# Patient Record
Sex: Female | Born: 1950
Health system: Southern US, Community
[De-identification: ages and names within clinical notes are randomized; demographics above are authoritative.]

## PROBLEM LIST (undated history)

## (undated) DIAGNOSIS — R519 Headache, unspecified: Secondary | ICD-10-CM

## (undated) DIAGNOSIS — K219 Gastro-esophageal reflux disease without esophagitis: Secondary | ICD-10-CM

## (undated) DIAGNOSIS — Z1401 Asymptomatic hemophilia A carrier: Secondary | ICD-10-CM

## (undated) DIAGNOSIS — L723 Sebaceous cyst: Secondary | ICD-10-CM

## (undated) DIAGNOSIS — K5792 Diverticulitis of intestine, part unspecified, without perforation or abscess without bleeding: Secondary | ICD-10-CM

## (undated) DIAGNOSIS — R51 Headache: Secondary | ICD-10-CM

## (undated) DIAGNOSIS — C50919 Malignant neoplasm of unspecified site of unspecified female breast: Secondary | ICD-10-CM

## (undated) DIAGNOSIS — C801 Malignant (primary) neoplasm, unspecified: Secondary | ICD-10-CM

## (undated) DIAGNOSIS — E079 Disorder of thyroid, unspecified: Secondary | ICD-10-CM

## (undated) DIAGNOSIS — E039 Hypothyroidism, unspecified: Secondary | ICD-10-CM

## (undated) HISTORY — DX: Sebaceous cyst: L72.3

## (undated) HISTORY — DX: Gastro-esophageal reflux disease without esophagitis: K21.9

## (undated) HISTORY — DX: Malignant (primary) neoplasm, unspecified: C80.1

## (undated) HISTORY — DX: Headache, unspecified: R51.9

## (undated) HISTORY — PX: INCISE AND DRAIN ABCESS: PRO64

## (undated) HISTORY — DX: Disorder of thyroid, unspecified: E07.9

## (undated) HISTORY — DX: Headache: R51

## (undated) HISTORY — DX: Diverticulitis of intestine, part unspecified, without perforation or abscess without bleeding: K57.92

---

## 1992-10-14 HISTORY — PX: TOTAL ABDOMINAL HYSTERECTOMY: SHX209

## 1992-10-14 HISTORY — PX: ABDOMINAL HYSTERECTOMY: SHX81

## 1996-10-14 DIAGNOSIS — C50919 Malignant neoplasm of unspecified site of unspecified female breast: Secondary | ICD-10-CM

## 1996-10-14 HISTORY — DX: Malignant neoplasm of unspecified site of unspecified female breast: C50.919

## 1996-10-14 HISTORY — PX: MASTECTOMY: SHX3

## 1998-02-09 ENCOUNTER — Encounter: Admission: RE | Admit: 1998-02-09 | Discharge: 1998-05-10 | Payer: Self-pay | Admitting: Radiation Oncology

## 1999-07-30 ENCOUNTER — Other Ambulatory Visit: Admission: RE | Admit: 1999-07-30 | Discharge: 1999-07-30 | Payer: Self-pay | Admitting: *Deleted

## 1999-09-05 ENCOUNTER — Encounter: Payer: Self-pay | Admitting: Hematology and Oncology

## 1999-09-05 ENCOUNTER — Encounter: Admission: RE | Admit: 1999-09-05 | Discharge: 1999-09-05 | Payer: Self-pay | Admitting: Hematology and Oncology

## 2000-03-11 ENCOUNTER — Encounter: Payer: Self-pay | Admitting: Hematology and Oncology

## 2000-03-11 ENCOUNTER — Encounter: Admission: RE | Admit: 2000-03-11 | Discharge: 2000-03-11 | Payer: Self-pay | Admitting: Hematology and Oncology

## 2001-11-23 ENCOUNTER — Ambulatory Visit (HOSPITAL_COMMUNITY): Admission: RE | Admit: 2001-11-23 | Discharge: 2001-11-23 | Payer: Self-pay | Admitting: Gastroenterology

## 2004-07-14 ENCOUNTER — Ambulatory Visit: Payer: Self-pay | Admitting: Oncology

## 2004-08-14 ENCOUNTER — Ambulatory Visit: Payer: Self-pay | Admitting: Oncology

## 2004-12-31 ENCOUNTER — Ambulatory Visit: Payer: Self-pay | Admitting: Oncology

## 2005-08-07 ENCOUNTER — Ambulatory Visit: Payer: Self-pay | Admitting: Oncology

## 2005-08-19 ENCOUNTER — Ambulatory Visit: Payer: Self-pay | Admitting: Oncology

## 2006-02-20 ENCOUNTER — Ambulatory Visit: Payer: Self-pay | Admitting: Oncology

## 2006-08-11 ENCOUNTER — Ambulatory Visit: Payer: Self-pay | Admitting: Oncology

## 2006-11-06 ENCOUNTER — Ambulatory Visit: Payer: Self-pay | Admitting: Oncology

## 2007-08-14 ENCOUNTER — Ambulatory Visit: Payer: Self-pay | Admitting: Oncology

## 2007-11-15 ENCOUNTER — Ambulatory Visit: Payer: Self-pay | Admitting: Oncology

## 2007-11-20 ENCOUNTER — Ambulatory Visit: Payer: Self-pay | Admitting: Oncology

## 2007-12-13 ENCOUNTER — Ambulatory Visit: Payer: Self-pay | Admitting: Oncology

## 2007-12-31 ENCOUNTER — Ambulatory Visit: Payer: Self-pay | Admitting: Gastroenterology

## 2008-11-04 ENCOUNTER — Ambulatory Visit: Payer: Self-pay | Admitting: Oncology

## 2008-11-14 ENCOUNTER — Ambulatory Visit: Payer: Self-pay | Admitting: Oncology

## 2008-11-18 ENCOUNTER — Ambulatory Visit: Payer: Self-pay | Admitting: Oncology

## 2008-12-12 ENCOUNTER — Ambulatory Visit: Payer: Self-pay | Admitting: Oncology

## 2009-11-10 ENCOUNTER — Ambulatory Visit: Payer: Self-pay | Admitting: Oncology

## 2010-10-25 ENCOUNTER — Ambulatory Visit: Payer: Self-pay

## 2010-11-19 ENCOUNTER — Ambulatory Visit: Payer: Self-pay

## 2011-01-22 ENCOUNTER — Ambulatory Visit: Payer: Self-pay | Admitting: Gastroenterology

## 2011-01-24 LAB — PATHOLOGY REPORT

## 2011-07-04 ENCOUNTER — Encounter (INDEPENDENT_AMBULATORY_CARE_PROVIDER_SITE_OTHER): Payer: Self-pay | Admitting: General Surgery

## 2011-07-04 ENCOUNTER — Ambulatory Visit (INDEPENDENT_AMBULATORY_CARE_PROVIDER_SITE_OTHER): Payer: 59 | Admitting: General Surgery

## 2011-07-04 VITALS — BP 140/82 | HR 80 | Temp 97.6°F | Resp 16 | Ht 62.0 in | Wt 172.2 lb

## 2011-07-04 DIAGNOSIS — L723 Sebaceous cyst: Secondary | ICD-10-CM

## 2011-07-04 NOTE — Patient Instructions (Signed)
Cover with gauze.  Remove strip Saturday.  Keep covered with gauze thereafter.  Take your antibiotics.

## 2011-07-04 NOTE — Progress Notes (Signed)
Subjective:     Patient ID: Charlotte Reyes, female   DOB: 27-May-1951, 60 y.o.   MRN: [id number]  HPI Patient has a history of several years of a cyst on her right shoulder. For the past couple days this has been painful and much larger. She was seen by her primary care physician. Doxycycline was prescribed. She is referred for urgent clinic.  Review of Systems  Constitutional: Negative.   HENT: Negative.   Eyes: Negative.   Respiratory: Negative.   Cardiovascular: Negative.   Genitourinary: Negative.   Musculoskeletal: Negative.   History of breast cancer    Objective:   Physical Exam  HENT:  Head: Normocephalic and atraumatic.  Eyes: Pupils are equal, round, and reactive to light.  Neck: Normal range of motion.  Cardiovascular: Normal rate and normal heart sounds.   Pulmonary/Chest: Effort normal. No respiratory distress. She has no wheezes.  Abdominal: Soft. There is no tenderness.  Right shoulder has a 2 cm erythematous mass consistent with an infected sebaceous cyst Procedure note: The area was prepped in a sterile fashion. Local anesthetic was injected. The cyst was incised and drained for the release of a large amount of purulent sebum. The wound was thoroughly cleaned out and packed with gauze. Hemostasis was good. A gauze dressing was applied     Assessment:     Infected sebaceous cyst    Plan:     The area was incised and drained as above. Wound care instructions were given. She will take the doxycycline as prescribed. See her back next week.

## 2011-07-24 ENCOUNTER — Encounter (INDEPENDENT_AMBULATORY_CARE_PROVIDER_SITE_OTHER): Payer: Self-pay | Admitting: General Surgery

## 2011-07-24 ENCOUNTER — Ambulatory Visit (INDEPENDENT_AMBULATORY_CARE_PROVIDER_SITE_OTHER): Payer: 59 | Admitting: General Surgery

## 2011-07-24 VITALS — BP 124/86 | HR 72 | Temp 97.6°F | Resp 16 | Ht 62.0 in | Wt 173.0 lb

## 2011-07-24 DIAGNOSIS — L723 Sebaceous cyst: Secondary | ICD-10-CM

## 2011-07-24 NOTE — Progress Notes (Signed)
Subjective:     Patient ID: Charlotte Reyes, female   DOB: 1951/01/11, 60 y.o.   MRN: [id number]  HPI Patient presents status post incision and drainage of infected sebaceous cyst right shoulder. She completed a course of doxycycline and she feels there is much better  Review of Systems     Objective:   Physical Exam    Physical exam the incision and drainage area is completely healed. There is no significant underlying nodularity. There is no evidence of ongoing infection. Assessment:     Doing very well status post incision and drainage of infected sebaceous cyst    Plan:     I reassured the patient that there is no evidence of malignancy. She has agreed to call if any swelling or changes, at the site. I do advise her that the cyst may recur and require formal excision. The questions were answered.

## 2011-07-24 NOTE — Patient Instructions (Signed)
Call if any changes at the site

## 2014-05-05 DIAGNOSIS — K219 Gastro-esophageal reflux disease without esophagitis: Secondary | ICD-10-CM | POA: Insufficient documentation

## 2014-05-19 ENCOUNTER — Ambulatory Visit: Payer: Self-pay | Admitting: Family Medicine

## 2015-11-28 DIAGNOSIS — N390 Urinary tract infection, site not specified: Secondary | ICD-10-CM | POA: Diagnosis not present

## 2015-11-28 DIAGNOSIS — R300 Dysuria: Secondary | ICD-10-CM | POA: Diagnosis not present

## 2016-01-01 ENCOUNTER — Other Ambulatory Visit: Payer: Self-pay | Admitting: Family Medicine

## 2016-01-01 DIAGNOSIS — Z1231 Encounter for screening mammogram for malignant neoplasm of breast: Secondary | ICD-10-CM

## 2016-01-05 DIAGNOSIS — L723 Sebaceous cyst: Secondary | ICD-10-CM | POA: Diagnosis not present

## 2016-01-11 DIAGNOSIS — L723 Sebaceous cyst: Secondary | ICD-10-CM | POA: Diagnosis not present

## 2016-01-16 ENCOUNTER — Other Ambulatory Visit: Payer: Self-pay | Admitting: Family Medicine

## 2016-01-16 ENCOUNTER — Ambulatory Visit
Admission: RE | Admit: 2016-01-16 | Discharge: 2016-01-16 | Disposition: A | Discharge status: Home / Self Care | Payer: PPO | Source: Ambulatory Visit | Attending: Family Medicine | Admitting: Family Medicine

## 2016-01-16 DIAGNOSIS — Z1231 Encounter for screening mammogram for malignant neoplasm of breast: Secondary | ICD-10-CM | POA: Diagnosis not present

## 2016-01-16 HISTORY — DX: Malignant neoplasm of unspecified site of unspecified female breast: C50.919

## 2016-03-05 DIAGNOSIS — Z4432 Encounter for fitting and adjustment of external left breast prosthesis: Secondary | ICD-10-CM | POA: Diagnosis not present

## 2016-03-05 DIAGNOSIS — C50112 Malignant neoplasm of central portion of left female breast: Secondary | ICD-10-CM | POA: Diagnosis not present

## 2016-03-12 DIAGNOSIS — E039 Hypothyroidism, unspecified: Secondary | ICD-10-CM | POA: Diagnosis not present

## 2016-03-12 DIAGNOSIS — Z131 Encounter for screening for diabetes mellitus: Secondary | ICD-10-CM | POA: Diagnosis not present

## 2016-03-19 DIAGNOSIS — K219 Gastro-esophageal reflux disease without esophagitis: Secondary | ICD-10-CM | POA: Diagnosis not present

## 2016-03-19 DIAGNOSIS — J301 Allergic rhinitis due to pollen: Secondary | ICD-10-CM | POA: Diagnosis not present

## 2016-03-19 DIAGNOSIS — C50112 Malignant neoplasm of central portion of left female breast: Secondary | ICD-10-CM | POA: Diagnosis not present

## 2016-03-19 DIAGNOSIS — Z13220 Encounter for screening for lipoid disorders: Secondary | ICD-10-CM | POA: Diagnosis not present

## 2016-03-19 DIAGNOSIS — E039 Hypothyroidism, unspecified: Secondary | ICD-10-CM | POA: Diagnosis not present

## 2016-03-19 DIAGNOSIS — Z79899 Other long term (current) drug therapy: Secondary | ICD-10-CM | POA: Diagnosis not present

## 2016-03-19 DIAGNOSIS — Z4432 Encounter for fitting and adjustment of external left breast prosthesis: Secondary | ICD-10-CM | POA: Diagnosis not present

## 2016-04-02 DIAGNOSIS — H2513 Age-related nuclear cataract, bilateral: Secondary | ICD-10-CM | POA: Diagnosis not present

## 2016-10-03 DIAGNOSIS — E039 Hypothyroidism, unspecified: Secondary | ICD-10-CM | POA: Diagnosis not present

## 2016-10-03 DIAGNOSIS — Z13220 Encounter for screening for lipoid disorders: Secondary | ICD-10-CM | POA: Diagnosis not present

## 2016-10-03 DIAGNOSIS — Z79899 Other long term (current) drug therapy: Secondary | ICD-10-CM | POA: Diagnosis not present

## 2016-10-04 DIAGNOSIS — Z0000 Encounter for general adult medical examination without abnormal findings: Secondary | ICD-10-CM | POA: Diagnosis not present

## 2016-10-04 DIAGNOSIS — Z23 Encounter for immunization: Secondary | ICD-10-CM | POA: Diagnosis not present

## 2016-10-04 DIAGNOSIS — Z13220 Encounter for screening for lipoid disorders: Secondary | ICD-10-CM | POA: Diagnosis not present

## 2016-10-04 DIAGNOSIS — Z79899 Other long term (current) drug therapy: Secondary | ICD-10-CM | POA: Diagnosis not present

## 2016-10-04 DIAGNOSIS — E039 Hypothyroidism, unspecified: Secondary | ICD-10-CM | POA: Diagnosis not present

## 2016-11-26 DIAGNOSIS — N390 Urinary tract infection, site not specified: Secondary | ICD-10-CM | POA: Diagnosis not present

## 2016-11-26 DIAGNOSIS — R300 Dysuria: Secondary | ICD-10-CM | POA: Diagnosis not present

## 2017-04-01 DIAGNOSIS — H2513 Age-related nuclear cataract, bilateral: Secondary | ICD-10-CM | POA: Diagnosis not present

## 2017-04-22 DIAGNOSIS — K210 Gastro-esophageal reflux disease with esophagitis: Secondary | ICD-10-CM | POA: Diagnosis not present

## 2017-04-22 DIAGNOSIS — Z86010 Personal history of colonic polyps: Secondary | ICD-10-CM | POA: Diagnosis not present

## 2017-07-29 ENCOUNTER — Other Ambulatory Visit: Payer: Self-pay | Admitting: Family Medicine

## 2017-07-29 DIAGNOSIS — Z1231 Encounter for screening mammogram for malignant neoplasm of breast: Secondary | ICD-10-CM

## 2017-08-12 ENCOUNTER — Ambulatory Visit
Admission: RE | Admit: 2017-08-12 | Discharge: 2017-08-12 | Disposition: A | Discharge status: Home / Self Care | Payer: PPO | Source: Ambulatory Visit | Attending: Family Medicine | Admitting: Family Medicine

## 2017-08-12 DIAGNOSIS — Z1231 Encounter for screening mammogram for malignant neoplasm of breast: Secondary | ICD-10-CM | POA: Diagnosis not present

## 2017-09-01 ENCOUNTER — Encounter: Payer: Self-pay | Admitting: *Deleted

## 2017-09-02 ENCOUNTER — Ambulatory Visit: Payer: PPO | Admitting: Anesthesiology

## 2017-09-02 ENCOUNTER — Encounter
Admission: RE | Disposition: A | Discharge status: Home / Self Care | Payer: Self-pay | Source: Ambulatory Visit | Attending: Gastroenterology

## 2017-09-02 ENCOUNTER — Encounter: Payer: Self-pay | Admitting: *Deleted

## 2017-09-02 ENCOUNTER — Ambulatory Visit
Admission: RE | Admit: 2017-09-02 | Discharge: 2017-09-02 | Disposition: A | Discharge status: Home / Self Care | Payer: PPO | Source: Ambulatory Visit | Attending: Gastroenterology | Admitting: Gastroenterology

## 2017-09-02 DIAGNOSIS — K5730 Diverticulosis of large intestine without perforation or abscess without bleeding: Secondary | ICD-10-CM | POA: Insufficient documentation

## 2017-09-02 DIAGNOSIS — Z1401 Asymptomatic hemophilia A carrier: Secondary | ICD-10-CM | POA: Diagnosis not present

## 2017-09-02 DIAGNOSIS — Z1211 Encounter for screening for malignant neoplasm of colon: Secondary | ICD-10-CM | POA: Diagnosis not present

## 2017-09-02 DIAGNOSIS — D125 Benign neoplasm of sigmoid colon: Secondary | ICD-10-CM | POA: Diagnosis not present

## 2017-09-02 DIAGNOSIS — Z86010 Personal history of colonic polyps: Secondary | ICD-10-CM | POA: Insufficient documentation

## 2017-09-02 DIAGNOSIS — E039 Hypothyroidism, unspecified: Secondary | ICD-10-CM | POA: Diagnosis not present

## 2017-09-02 DIAGNOSIS — Z853 Personal history of malignant neoplasm of breast: Secondary | ICD-10-CM | POA: Diagnosis not present

## 2017-09-02 DIAGNOSIS — Z79899 Other long term (current) drug therapy: Secondary | ICD-10-CM | POA: Diagnosis not present

## 2017-09-02 DIAGNOSIS — K635 Polyp of colon: Secondary | ICD-10-CM | POA: Diagnosis not present

## 2017-09-02 DIAGNOSIS — K5790 Diverticulosis of intestine, part unspecified, without perforation or abscess without bleeding: Secondary | ICD-10-CM | POA: Diagnosis not present

## 2017-09-02 DIAGNOSIS — Z800 Family history of malignant neoplasm of digestive organs: Secondary | ICD-10-CM | POA: Diagnosis not present

## 2017-09-02 HISTORY — DX: Asymptomatic hemophilia A carrier: Z14.01

## 2017-09-02 HISTORY — DX: Hypothyroidism, unspecified: E03.9

## 2017-09-02 HISTORY — PX: COLONOSCOPY WITH PROPOFOL: SHX5780

## 2017-09-02 SURGERY — COLONOSCOPY WITH PROPOFOL
Anesthesia: General

## 2017-09-02 MED ORDER — PROPOFOL 10 MG/ML IV BOLUS
INTRAVENOUS | Status: DC | PRN
  Administered 2017-09-02: 50 mg via INTRAVENOUS

## 2017-09-02 MED ORDER — LIDOCAINE HCL (PF) 2 % IJ SOLN
INTRAMUSCULAR | Status: AC
  Filled 2017-09-02: qty 10

## 2017-09-02 MED ORDER — PROPOFOL 500 MG/50ML IV EMUL
INTRAVENOUS | Status: AC
  Filled 2017-09-02: qty 50

## 2017-09-02 MED ORDER — SODIUM CHLORIDE 0.9 % IV SOLN: INTRAVENOUS | Status: DC

## 2017-09-02 MED ORDER — PROPOFOL 500 MG/50ML IV EMUL
INTRAVENOUS | Status: DC | PRN
  Administered 2017-09-02: 150 ug/kg/min via INTRAVENOUS

## 2017-09-02 MED ORDER — SODIUM CHLORIDE 0.9 % IV SOLN
INTRAVENOUS | Status: DC
  Administered 2017-09-02: 1000 mL via INTRAVENOUS
  Administered 2017-09-02: 08:00:00 via INTRAVENOUS

## 2017-09-02 MED ORDER — LIDOCAINE HCL (CARDIAC) 20 MG/ML IV SOLN
INTRAVENOUS | Status: DC | PRN
  Administered 2017-09-02: 80 mg via INTRAVENOUS

## 2017-09-02 NOTE — H&P (Signed)
Outpatient short stay form Pre-procedure 09/02/2017 8:14 AM Lollie Sails MD  Primary Physician: Dr Derinda Late  Reason for visit:  Colonoscopy  History of present illness:  Patient is a 66 year old female presenting today as above. She has a personal history of adenomatous colon polyps and family history of colon cancer in a primary relative. She tolerated her prep well. She takes no aspirin or blood thinning agents although occasionally will take a Excedrin has not taken that for 4-5 days.    Current Facility-Administered Medications:  .  0.9 %  sodium chloride infusion, , Intravenous, Continuous, Lollie Sails, MD  Medications Prior to Admission  Medication Sig Dispense Refill Last Dose  . fexofenadine (ALLEGRA) 180 MG tablet Take 180 mg daily by mouth.     . lansoprazole (PREVACID) 15 MG capsule Take 15 mg daily at 12 noon by mouth.     . Multiple Vitamin (MULTIVITAMIN) tablet Take 1 tablet daily by mouth.     . levothyroxine (SYNTHROID, LEVOTHROID) 88 MCG tablet Take 88 mcg by mouth daily.     Taking  . liothyronine (CYTOMEL) 5 MCG tablet Take 5 mcg by mouth daily.     Taking     No Known Allergies   Past Medical History:  Diagnosis Date  . Breast cancer ([place]) 1998   left  . Cancer ([place])   . Generalized headaches    may be due to stress  . 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 Hypothyroidism   . Sebaceous cyst    on back  . Thyroid disease     Review of systems:      Physical Exam    Heart and lungs: Regular rate and rhythm without rub or gallop, lungs are bilaterally clear    HEENT: Normocephalic atraumatic eyes are anicteric    Other:     Pertinant exam for procedure: Soft nontender nondistended bowel sounds positive normoactive    Planned proceedures: Colonoscopy and indicated procedures. I have discussed the risks benefits and complications of procedures to include not limited to bleeding, infection, perforation and the risk of sedation and the patient  wishes to proceed.    Lollie Sails, MD Gastroenterology 09/02/2017  8:14 AM

## 2017-09-02 NOTE — Transfer of Care (Signed)
Immediate Anesthesia Transfer of Care Note  Patient: Charlotte Reyes  Procedure(s) Performed: COLONOSCOPY WITH PROPOFOL (N/A )  Patient Location: Endoscopy Unit  Anesthesia Type:General  Level of Consciousness: drowsy and patient cooperative  Airway & Oxygen Therapy: Patient Spontanous Breathing and Patient connected to nasal cannula oxygen  Post-op Assessment: Report given to RN, Post -op Vital signs reviewed and stable and Patient moving all extremities X 4  Post vital signs: Reviewed and stable  Last Vitals:  Vitals:   09/02/17 0804  BP: 116/77  Pulse: 94  Resp: 20  Temp: 36.6 C  SpO2: 100%    Last Pain:  Vitals:   09/02/17 0804  TempSrc: Tympanic         Complications: No apparent anesthesia complications

## 2017-09-02 NOTE — Anesthesia Post-op Follow-up Note (Signed)
Anesthesia QCDR form completed.        

## 2017-09-02 NOTE — Anesthesia Preprocedure Evaluation (Signed)
Anesthesia Evaluation  Patient identified by MRN, date of birth, ID band Patient awake    Reviewed: Allergy & Precautions, H&P , NPO status , Patient's Chart, lab work & pertinent test results, reviewed documented beta blocker date and time   Airway Mallampati: II   Neck ROM: full    Dental  (+) Poor Dentition   Pulmonary neg pulmonary ROS,    Pulmonary exam normal        Cardiovascular negative cardio ROS Normal cardiovascular exam Rhythm:regular Rate:Normal     Neuro/Psych  Headaches, negative neurological ROS  negative psych ROS   GI/Hepatic negative GI ROS, Neg liver ROS,   Endo/Other  negative endocrine ROSHypothyroidism   Renal/GU negative Renal ROS  negative genitourinary   Musculoskeletal   Abdominal   Peds  Hematology negative hematology ROS (+)   Anesthesia Other Findings Past Medical History: 1998: Breast cancer ([place])     Comment:  left No date: Cancer ([place]) No date: Generalized headaches     Comment:  may be due to stress 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 No date: Hypothyroidism No date: Sebaceous cyst     Comment:  on back No date: Thyroid disease Past Surgical History: 1994: ABDOMINAL HYSTERECTOMY No date: INCISE AND DRAIN ABCESS     Comment:  sebaceous cyst on back 1998: MASTECTOMY     Comment:  left   Reproductive/Obstetrics negative OB ROS                             Anesthesia Physical Anesthesia Plan  ASA: III  Anesthesia Plan: General   Post-op Pain Management:    Induction:   PONV Risk Score and Plan: 3  Airway Management Planned:   Additional Equipment:   Intra-op Plan:   Post-operative Plan:   Informed Consent: I have reviewed the patients History and Physical, chart, labs and discussed the procedure including the risks, benefits and alternatives for the proposed anesthesia with the patient or authorized representative who has indicated his/her  understanding and acceptance.   Dental Advisory Given  Plan Discussed with: CRNA  Anesthesia Plan Comments:         Anesthesia Quick Evaluation

## 2017-09-02 NOTE — Op Note (Addendum)
[facility] Gastroenterology Patient Name: Charlotte Reyes Procedure Date: 09/02/2017 8:13 AM MRN: [id number] Account #: [id number] Date of Birth: 09-22-1951 Admit Type: Outpatient Age: 66 Room: [facility] ENDO ROOM 3 Gender: Female Note Status: Finalized Procedure:            Colonoscopy Indications:          Family history of colon cancer in a first-degree                        relative, Personal history of colonic polyps Providers:            Lollie Sails, MD Referring MD:         Caprice Renshaw MD (Referring MD) Medicines:            Monitored Anesthesia Care Complications:        No immediate complications. Procedure:            Pre-Anesthesia Assessment:                       - ASA Grade Assessment: III - A patient with severe                        systemic disease.                       After obtaining informed consent, the colonoscope was                        passed under direct vision. Throughout the procedure,                        the patient's blood pressure, pulse, and oxygen                        saturations were monitored continuously. The                        Colonoscope was introduced through the anus and                        advanced to the the cecum, identified by appendiceal                        orifice and ileocecal valve. The colonoscopy was                        performed with moderate difficulty due to a tortuous                        colon. Successful completion of the procedure was aided                        by changing the patient to a supine position and                        changing the patient to a prone position. The patient                        tolerated the procedure well. The quality of the bowel  preparation was good. Findings:      A 3 mm polyp was found in the sigmoid colon. The polyp was sessile. The       polyp was removed with a cold biopsy forceps. Resection and retrieval   were complete.      Multiple medium-mouthed diverticula were found in the sigmoid colon and       descending colon.      The digital rectal exam was normal.      The exam was otherwise without abnormality. Impression:           - One 3 mm polyp in the sigmoid colon, removed with a                        cold biopsy forceps. Resected and retrieved.                       - Diverticulosis in the sigmoid colon and in the                        descending colon.                       - The examination was otherwise normal. Recommendation:       - Discharge patient to home.                       - Advance diet as tolerated. Procedure Code(s):    --- Professional ---                       [phone], Colonoscopy, flexible; with biopsy, single or                        multiple Diagnosis Code(s):    --- Professional ---                       D12.5, Benign neoplasm of sigmoid colon                       Z80.0, Family history of malignant neoplasm of                        digestive organs                       Z86.010, Personal history of colonic polyps                       K57.30, Diverticulosis of large intestine without                        perforation or abscess without bleeding CPT copyright 2016 [employer]. All rights reserved. The codes documented in this report are preliminary and upon coder review may  be revised to meet current compliance requirements. Lollie Sails, MD 09/02/2017 8:52:04 AM This report has been signed electronically. Number of Addenda: 0 Note Initiated On: 09/02/2017 8:13 AM Scope Withdrawal Time: 0 hours 9 minutes 53 seconds  Total Procedure Duration: 0 hours 23 minutes 4 seconds       [facility]

## 2017-09-03 ENCOUNTER — Encounter: Payer: Self-pay | Admitting: Gastroenterology

## 2017-09-03 LAB — SURGICAL PATHOLOGY

## 2017-09-03 NOTE — Anesthesia Postprocedure Evaluation (Signed)
Anesthesia Post Note  Patient: Charlotte Reyes  Procedure(s) Performed: COLONOSCOPY WITH PROPOFOL (N/A )  Patient location during evaluation: PACU Anesthesia Type: General Level of consciousness: awake and alert Pain management: pain level controlled Vital Signs Assessment: post-procedure vital signs reviewed and stable Respiratory status: spontaneous breathing, nonlabored ventilation, respiratory function stable and patient connected to nasal cannula oxygen Cardiovascular status: blood pressure returned to baseline and stable Postop Assessment: no apparent nausea or vomiting Anesthetic complications: no     Last Vitals:  Vitals:   09/02/17 0912 09/02/17 0922  BP: 125/64 135/71  Pulse: 93 83  Resp: 17 15  Temp:    SpO2: 100% 100%    Last Pain:  Vitals:   09/03/17 0730  TempSrc:   PainSc: 0-No pain                 Molli Barrows

## 2017-10-02 DIAGNOSIS — E039 Hypothyroidism, unspecified: Secondary | ICD-10-CM | POA: Diagnosis not present

## 2017-10-02 DIAGNOSIS — Z13220 Encounter for screening for lipoid disorders: Secondary | ICD-10-CM | POA: Diagnosis not present

## 2017-10-02 DIAGNOSIS — Z79899 Other long term (current) drug therapy: Secondary | ICD-10-CM | POA: Diagnosis not present

## 2017-10-09 DIAGNOSIS — Z0000 Encounter for general adult medical examination without abnormal findings: Secondary | ICD-10-CM | POA: Diagnosis not present

## 2018-07-15 ENCOUNTER — Other Ambulatory Visit: Payer: Self-pay | Admitting: Family Medicine

## 2018-07-15 DIAGNOSIS — Z1231 Encounter for screening mammogram for malignant neoplasm of breast: Secondary | ICD-10-CM

## 2018-07-16 DIAGNOSIS — C50112 Malignant neoplasm of central portion of left female breast: Secondary | ICD-10-CM | POA: Diagnosis not present

## 2018-07-16 DIAGNOSIS — Z4432 Encounter for fitting and adjustment of external left breast prosthesis: Secondary | ICD-10-CM | POA: Diagnosis not present

## 2018-07-31 DIAGNOSIS — Z4432 Encounter for fitting and adjustment of external left breast prosthesis: Secondary | ICD-10-CM | POA: Diagnosis not present

## 2018-07-31 DIAGNOSIS — C50112 Malignant neoplasm of central portion of left female breast: Secondary | ICD-10-CM | POA: Diagnosis not present

## 2018-08-04 DIAGNOSIS — J0100 Acute maxillary sinusitis, unspecified: Secondary | ICD-10-CM | POA: Diagnosis not present

## 2018-08-13 ENCOUNTER — Ambulatory Visit
Admission: RE | Admit: 2018-08-13 | Discharge: 2018-08-13 | Disposition: A | Discharge status: Home / Self Care | Payer: PPO | Source: Ambulatory Visit | Attending: Family Medicine | Admitting: Family Medicine

## 2018-08-13 DIAGNOSIS — Z1231 Encounter for screening mammogram for malignant neoplasm of breast: Secondary | ICD-10-CM

## 2018-10-13 DIAGNOSIS — E039 Hypothyroidism, unspecified: Secondary | ICD-10-CM | POA: Diagnosis not present

## 2018-10-13 DIAGNOSIS — E7800 Pure hypercholesterolemia, unspecified: Secondary | ICD-10-CM | POA: Diagnosis not present

## 2018-10-13 DIAGNOSIS — Z79899 Other long term (current) drug therapy: Secondary | ICD-10-CM | POA: Diagnosis not present

## 2018-10-20 DIAGNOSIS — Z0000 Encounter for general adult medical examination without abnormal findings: Secondary | ICD-10-CM | POA: Diagnosis not present

## 2019-03-19 DIAGNOSIS — H524 Presbyopia: Secondary | ICD-10-CM | POA: Diagnosis not present

## 2019-12-06 ENCOUNTER — Other Ambulatory Visit: Payer: Self-pay | Admitting: Family Medicine

## 2019-12-06 DIAGNOSIS — Z1231 Encounter for screening mammogram for malignant neoplasm of breast: Secondary | ICD-10-CM

## 2020-01-07 ENCOUNTER — Other Ambulatory Visit: Payer: Self-pay | Admitting: Family Medicine

## 2020-01-07 ENCOUNTER — Ambulatory Visit
Admission: RE | Admit: 2020-01-07 | Discharge: 2020-01-07 | Disposition: A | Discharge status: Home / Self Care | Payer: Medicare HMO | Source: Ambulatory Visit | Attending: Family Medicine | Admitting: Family Medicine

## 2020-01-07 DIAGNOSIS — Z1231 Encounter for screening mammogram for malignant neoplasm of breast: Secondary | ICD-10-CM | POA: Insufficient documentation

## 2020-07-31 ENCOUNTER — Ambulatory Visit: Payer: PPO | Attending: Internal Medicine

## 2020-07-31 DIAGNOSIS — Z23 Encounter for immunization: Secondary | ICD-10-CM

## 2020-07-31 NOTE — Progress Notes (Signed)
   Covid-19 Vaccination Clinic  Name:  Meridee Branum    MRN: [id number] DOB: 06/27/1951  07/31/2020  Ms. Meharg was observed post Covid-19 immunization for 15 minutes without incident. She was provided with Vaccine Information Sheet and instruction to access the V-Safe system.   Ms. Roehm was instructed to call 911 with any severe reactions post vaccine: Marland Kitchen Difficulty breathing  . Swelling of face and throat  . A fast heartbeat  . A bad rash all over body  . Dizziness and weakness

## 2021-01-23 ENCOUNTER — Other Ambulatory Visit: Payer: Self-pay | Admitting: Family Medicine

## 2021-01-23 DIAGNOSIS — Z1231 Encounter for screening mammogram for malignant neoplasm of breast: Secondary | ICD-10-CM

## 2021-02-06 ENCOUNTER — Ambulatory Visit
Admission: RE | Admit: 2021-02-06 | Discharge: 2021-02-06 | Disposition: A | Discharge status: Home / Self Care | Payer: Medicare HMO | Source: Ambulatory Visit | Attending: Family Medicine | Admitting: Family Medicine

## 2021-02-06 ENCOUNTER — Other Ambulatory Visit: Payer: Self-pay

## 2021-02-06 DIAGNOSIS — Z1231 Encounter for screening mammogram for malignant neoplasm of breast: Secondary | ICD-10-CM | POA: Diagnosis not present

## 2021-08-25 IMAGING — MG DIGITAL SCREENING UNILAT RIGHT W/ TOMO W/ CAD
4 series · 4 of 12 positions shown · non-contrast
Comparison: Previous exam(s).

CLINICAL DATA: Screening.

EXAM:
DIGITAL SCREENING UNILATERAL RIGHT MAMMOGRAM WITH CAD AND TOMO

[R MLO synth-2D]
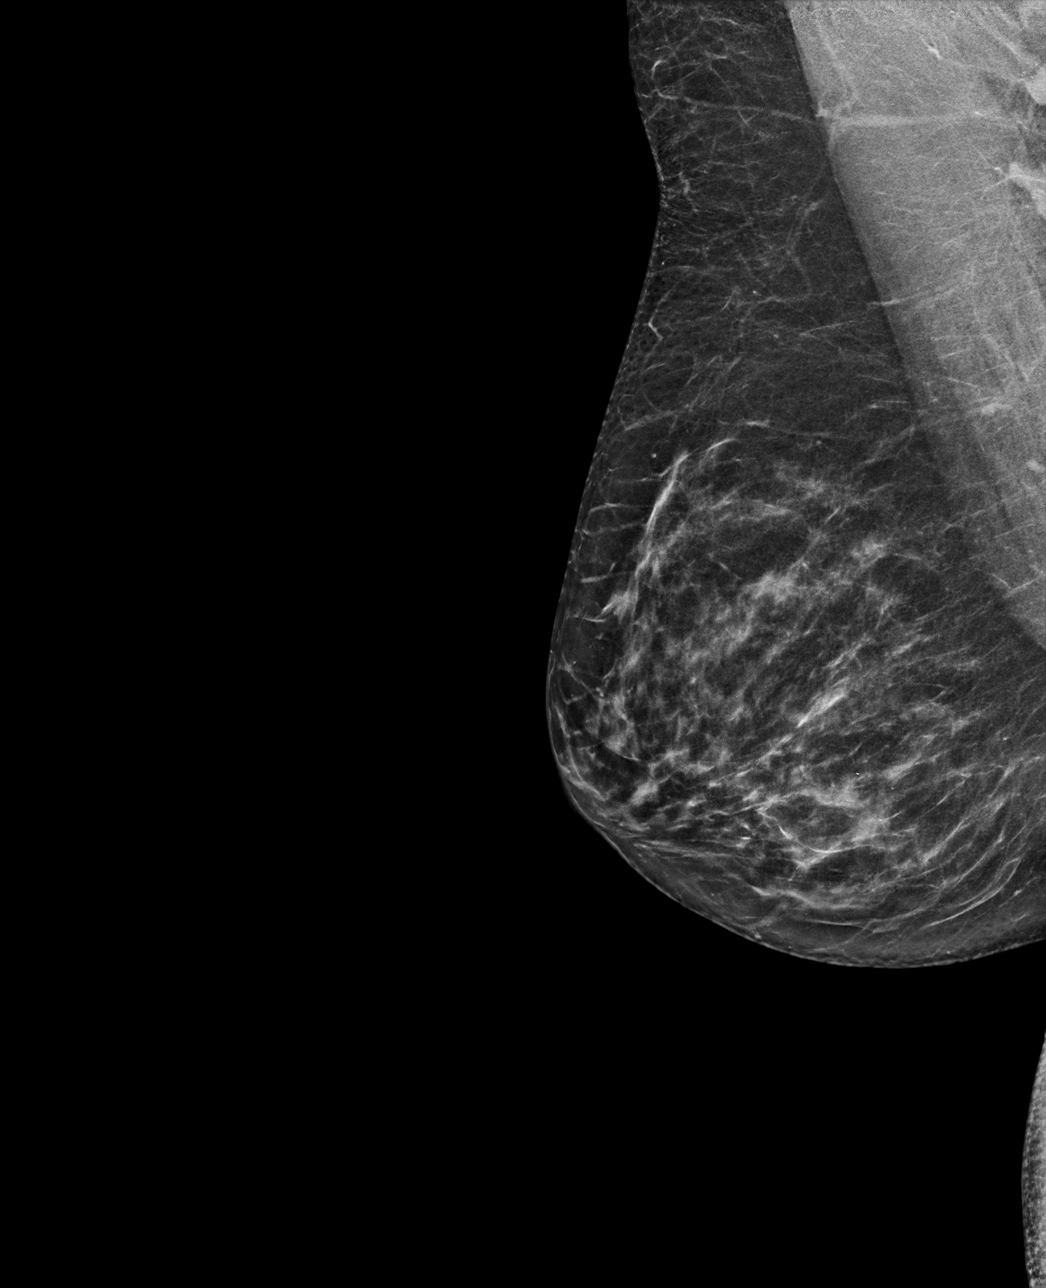

[R CC synth-2D]
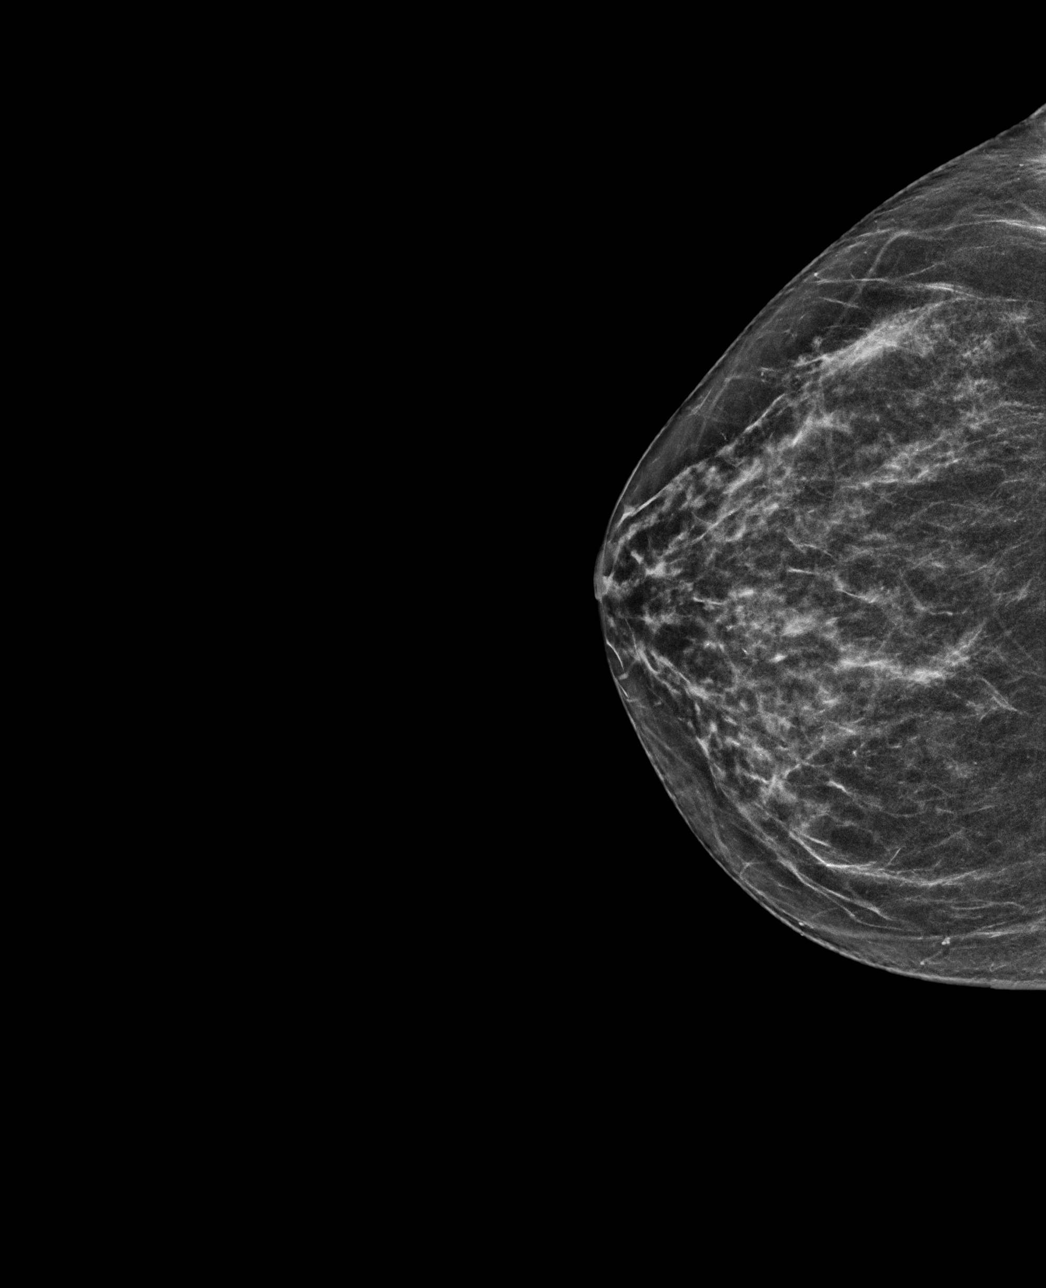

[R MLO tomo · tomo slice 31/62.0]
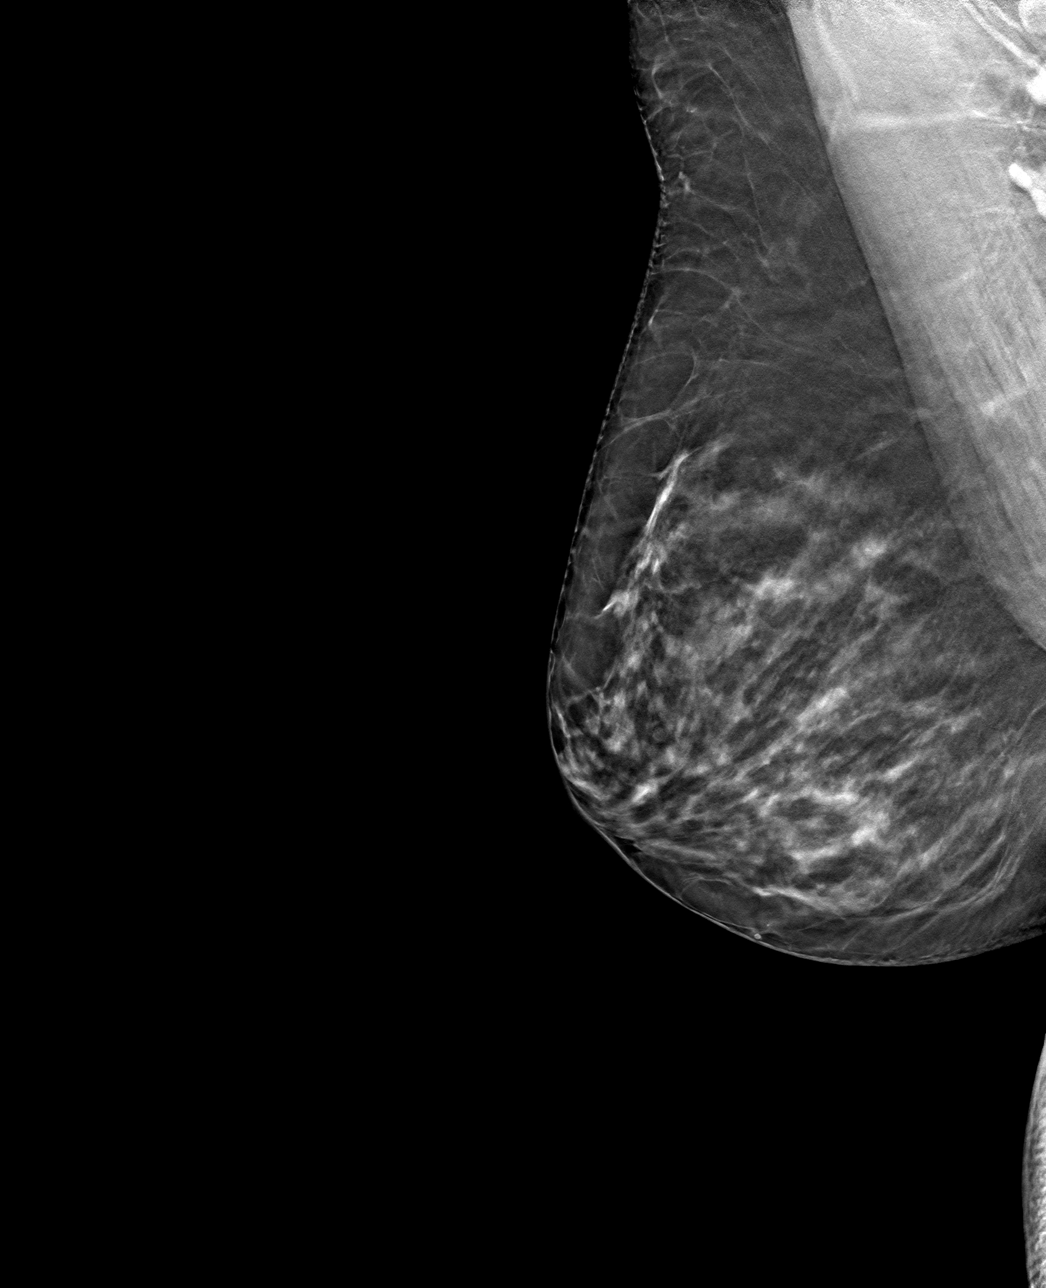

[R CC tomo · tomo slice 30/59.0]
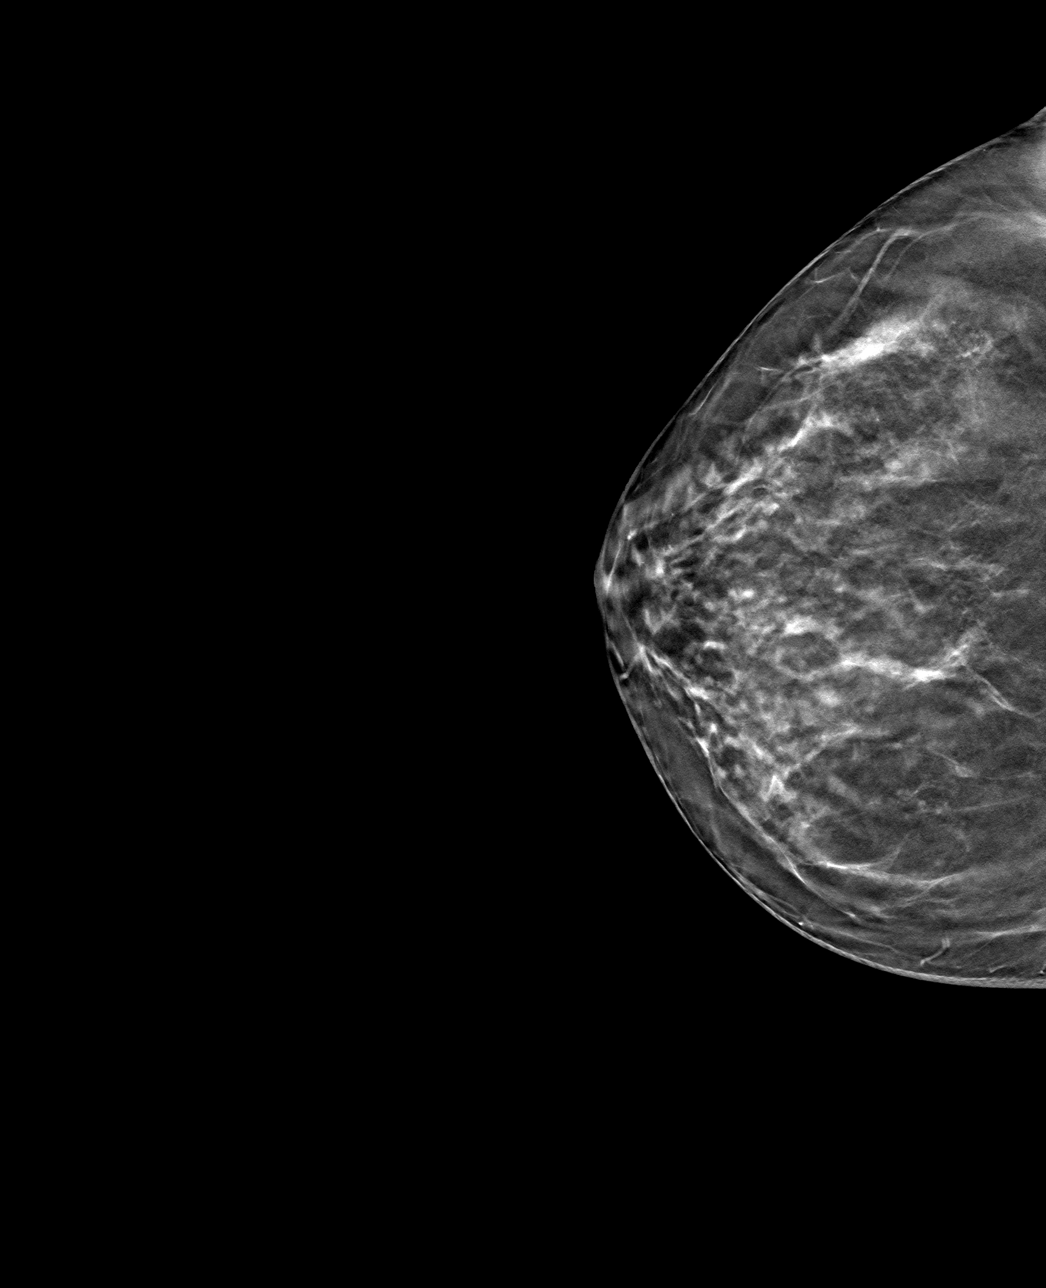

[4 of 12 positions shown; findings below may reference images not displayed]

ACR Breast Density Category c: The breast tissue is heterogeneously
dense, which may obscure small masses.
FINDINGS: There are no findings suspicious for malignancy. Images were
processed with CAD.
IMPRESSION: No mammographic evidence of malignancy. A result letter of this
screening mammogram will be mailed directly to the patient.

RECOMMENDATION:
Screening mammogram in one year. (Code:3W-V-17Z)

BI-RADS CATEGORY  1: Negative.

## 2022-07-29 ENCOUNTER — Other Ambulatory Visit: Payer: Self-pay | Admitting: Family Medicine

## 2022-07-29 DIAGNOSIS — Z1231 Encounter for screening mammogram for malignant neoplasm of breast: Secondary | ICD-10-CM

## 2022-08-27 ENCOUNTER — Ambulatory Visit
Admission: RE | Admit: 2022-08-27 | Discharge: 2022-08-27 | Disposition: A | Discharge status: Home / Self Care | Payer: Medicare HMO | Source: Ambulatory Visit | Attending: Family Medicine | Admitting: Family Medicine

## 2022-08-27 DIAGNOSIS — Z1231 Encounter for screening mammogram for malignant neoplasm of breast: Secondary | ICD-10-CM | POA: Insufficient documentation

## 2023-06-03 ENCOUNTER — Other Ambulatory Visit: Payer: Self-pay | Admitting: Gastroenterology

## 2023-06-03 DIAGNOSIS — R1030 Lower abdominal pain, unspecified: Secondary | ICD-10-CM

## 2023-06-04 ENCOUNTER — Ambulatory Visit
Admission: RE | Admit: 2023-06-04 | Discharge: 2023-06-04 | Disposition: A | Discharge status: Home / Self Care | Payer: Medicare HMO | Source: Ambulatory Visit | Attending: Gastroenterology | Admitting: Gastroenterology

## 2023-06-04 DIAGNOSIS — R1030 Lower abdominal pain, unspecified: Secondary | ICD-10-CM | POA: Insufficient documentation

## 2023-06-04 MED ORDER — IOHEXOL 300 MG/ML  SOLN
100.0000 mL | Freq: Once | INTRAMUSCULAR | Status: AC | PRN
  Administered 2023-06-04: 100 mL via INTRAVENOUS

## 2023-06-06 ENCOUNTER — Emergency Department
Admission: EM | Admit: 2023-06-06 | Discharge: 2023-06-06 | Disposition: A | Discharge status: Home / Self Care | Payer: Medicare HMO | Attending: Emergency Medicine | Admitting: Emergency Medicine

## 2023-06-06 ENCOUNTER — Other Ambulatory Visit: Payer: Self-pay

## 2023-06-06 ENCOUNTER — Encounter: Payer: Self-pay | Admitting: Emergency Medicine

## 2023-06-06 DIAGNOSIS — K5792 Diverticulitis of intestine, part unspecified, without perforation or abscess without bleeding: Secondary | ICD-10-CM

## 2023-06-06 DIAGNOSIS — R1032 Left lower quadrant pain: Secondary | ICD-10-CM | POA: Diagnosis present

## 2023-06-06 DIAGNOSIS — K5733 Diverticulitis of large intestine without perforation or abscess with bleeding: Secondary | ICD-10-CM | POA: Insufficient documentation

## 2023-06-06 LAB — COMPREHENSIVE METABOLIC PANEL
ALT: 13 U/L (ref 0–44)
AST: 13 U/L — ABNORMAL LOW (ref 15–41)
Albumin: 3.5 g/dL (ref 3.5–5.0)
Alkaline Phosphatase: 72 U/L (ref 38–126)
Anion gap: 9 (ref 5–15)
BUN: 11 mg/dL (ref 8–23)
CO2: 27 mmol/L (ref 22–32)
Calcium: 8.7 mg/dL — ABNORMAL LOW (ref 8.9–10.3)
Chloride: 102 mmol/L (ref 98–111)
Creatinine, Ser: 0.75 mg/dL (ref 0.44–1.00)
GFR, Estimated: >60 mL/min (ref >60)
Glucose, Bld: 158 mg/dL — ABNORMAL HIGH (ref 70–99)
Potassium: 3.5 mmol/L (ref 3.5–5.1)
Sodium: 138 mmol/L (ref 135–145)
Total Bilirubin: 0.6 mg/dL (ref 0.3–1.2)
Total Protein: 7.4 g/dL (ref 6.5–8.1)

## 2023-06-06 LAB — CBC
HCT: 34.4 % — ABNORMAL LOW (ref 36.0–46.0)
Hemoglobin: 11.2 g/dL — ABNORMAL LOW (ref 12.0–15.0)
MCH: 29.7 pg (ref 26.0–34.0)
MCHC: 32.6 g/dL (ref 30.0–36.0)
MCV: 91.2 fL (ref 80.0–100.0)
Platelets: 394 10*3/uL (ref 150–400)
RBC: 3.77 MIL/uL — ABNORMAL LOW (ref 3.87–5.11)
RDW: 12.1 % (ref 11.5–15.5)
WBC: 8.6 10*3/uL (ref 4.0–10.5)
nRBC: 0 % (ref 0.0–0.2)

## 2023-06-06 LAB — LIPASE, BLOOD: Lipase: 26 U/L (ref 11–51)

## 2023-06-06 MED ORDER — AMOXICILLIN-POT CLAVULANATE 875-125 MG PO TABS: 1.0000 | ORAL_TABLET | Freq: Two times a day (BID) | ORAL | 0 refills | Status: AC

## 2023-06-06 NOTE — ED Triage Notes (Signed)
Pt to ED via POV. Pt states that she had a CT of her abdomen and pelvis on Tuesday. Pt was told that she needed to come to the ED due to the results of her scan. Pt denies fever. Pt states that the pain in her lower abdomen has increased. Pt is currently in NAD.

## 2023-06-06 NOTE — Discharge Instructions (Addendum)
You are seen in the emergency department today for your diverticulitis.  The general surgeon has seen you and recommends oral antibiotics and to follow-up in his clinic in 2 weeks.  He will talk with the interventional radiology team about whether you need to come in for an outpatient procedure to have any of the abscesses drained.  Please return to the emergency department if you notice severe worsening of abdominal pain symptoms, multiple episodes of vomiting, or new fevers.

## 2023-06-06 NOTE — ED Provider Notes (Signed)
[facility] Provider Note    Event Date/Time   First MD Initiated Contact with Patient 06/06/23 1237     (approximate)   History   Abdominal Pain   HPI Charlotte Reyes is a 72 y.o. female presenting today for abdominal pain.  Patient reports 2 weeks of lower abdominal pain.  She was evaluated by her primary care provider outpatient who got a CT 2 days ago.  The CT abdomen/pelvis showed acute sigmoid diverticulitis with 2 adjacent abscesses.  She was told by them to come to the emergency department for further evaluation.  At this time, she notes 2/10 lower abdominal pain.  She has intermittent nausea but no vomiting.  Otherwise denies fevers and chills.  Episodes of diarrhea 2 weeks ago but none since.  Prior history of hysterectomy but no other abdominal surgeries.  Needs CT imaging results from 06/04/2023 in patient's chart noting diverticulitis with abscesses.     Physical Exam   Triage Vital Signs: ED Triage Vitals  Encounter Vitals Group     BP 06/06/23 1049 (!) 159/69     Systolic BP Percentile --      Diastolic BP Percentile --      Pulse Rate 06/06/23 1049 (!) 104     Resp 06/06/23 1049 16     Temp 06/06/23 1049 98 F (36.7 C)     Temp src --      SpO2 06/06/23 1049 96 %     Weight 06/06/23 1052 167 lb (75.8 kg)     Height 06/06/23 1052 5\' 1"  (1.549 m)     Head Circumference --      Peak Flow --      Pain Score --      Pain Loc --      Pain Education --      Exclude from Growth Chart --     Most recent vital signs: Vitals:   06/06/23 1049 06/06/23 1252  BP: (!) 159/69 138/78  Pulse: (!) 104 92  Resp: 16 18  Temp: 98 F (36.7 C)   SpO2: 96% 98%   Physical Exam: I have reviewed the vital signs and nursing notes. General: Awake, alert, no acute distress.  Nontoxic appearing. Head:  Atraumatic, normocephalic.   ENT:  EOM intact, PERRL. Oral mucosa is pink and moist with no lesions. Neck: Neck is supple with full range of  motion, No meningeal signs. Cardiovascular:  RRR, No murmurs. Peripheral pulses palpable and equal bilaterally. Respiratory:  Symmetrical chest wall expansion.  No rhonchi, rales, or wheezes.  Good air movement throughout.  No use of accessory muscles.   Musculoskeletal:  No cyanosis or edema. Moving extremities with full ROM Abdomen:  Soft, mild tenderness to palpation in the left lower quadrant, nondistended. Neuro:  GCS 15, moving all four extremities, interacting appropriately. Speech clear. Psych:  Calm, appropriate.   Skin:  Warm, dry, no rash.     ED Results / Procedures / Treatments   Labs (all labs ordered are listed, but only abnormal results are displayed) Labs Reviewed  COMPREHENSIVE METABOLIC PANEL - Abnormal; Notable for the following components:      Result Value   Glucose, Bld 158 (*)    Calcium 8.7 (*)    AST 13 (*)    All other components within normal limits  CBC - Abnormal; Notable for the following components:   RBC 3.77 (*)    Hemoglobin 11.2 (*)    HCT 34.4 (*)  All other components within normal limits  LIPASE, BLOOD  URINALYSIS, ROUTINE W REFLEX MICROSCOPIC     EKG    RADIOLOGY    PROCEDURES:  Critical Care performed: No  Procedures   MEDICATIONS ORDERED IN ED: Medications - No data to display   IMPRESSION / MDM / ASSESSMENT AND PLAN / ED COURSE  I reviewed the triage vital signs and the nursing notes.                              Differential diagnosis includes, but is not limited to, acute diverticulitis with abscesses.  Patient's presentation is most consistent with acute presentation with potential threat to life or bodily function.  Patient is a 72 year old female presenting today for CT abdomen/pelvis done outpatient showing concern for acute diverticulitis with abscesses.  Physical exam largely reassuring with minimal pain complaints at this time and vital signs stable.  General surgery, Dr. Tonna Boehringer, evaluated patient in the  emergency department.  Given small size of the abscesses, he feels safe with discharging patient with Augmentin and follow-up in his clinic in 2 weeks.  He will discuss the case with interventional radiology about whether she needs any outpatient drainage of the abscesses.  Otherwise, he felt stable with her going home.  Patient was given strict return precautions and antibiotics sent to her pharmacy.  The patient is on the cardiac monitor to evaluate for evidence of arrhythmia and/or significant heart rate changes. Clinical Course as of 06/06/23 1347  Fri Jun 06, 2023  1239 Comprehensive metabolic panel(!) unremarkable [DW]  1239 Lipase: 26 [DW]  1239 CBC(!) unremarkable [DW]  1321 General surgery to come evaluate patient [DW]  1342 General surgery evaluated patient.  Recommending discharge on Augmentin and follow-up in their clinic in 2 weeks.  They will talk with IR about potential drainage outpatient.  Otherwise, no further intervention needed today. [DW]    Clinical Course User Index [DW] Janith Lima, MD     FINAL CLINICAL IMPRESSION(S) / ED DIAGNOSES   Final diagnoses:  Acute diverticulitis     Rx / DC Orders   ED Discharge Orders          Ordered    Ambulatory referral to General Surgery       Comments: Diverticulitis follow up   06/06/23 1347    amoxicillin-clavulanate (AUGMENTIN) 875-125 MG tablet  2 times daily        06/06/23 1347             Note:  This document was prepared using Dragon voice recognition software and may include unintentional dictation errors.   Janith Lima, MD 06/06/23 [phone]

## 2023-06-06 NOTE — Consult Note (Signed)
Subjective:   CC: Diverticulitis with abscess  HPI:  Charlotte Reyes is a 72 y.o. female who was consulted by Anner Crete for issue above.  Symptoms were first noted several weeks ago. Pain is sharp, confined to the left lower quadrant, without radiation.  Associated with alternating constipation diarrhea, exacerbated by nothing specific.  Episodic worsening of pain but then decrease without any specific intervention.  GI consult as an outpatient and CT recently ordered which showed above.  Presented to ED due to recommendations.  Patient currently stating pain is well-controlled, no complaints.   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  Past Surgical History:  Past Surgical History:  Procedure Laterality Date   ABDOMINAL HYSTERECTOMY  1994   COLONOSCOPY WITH PROPOFOL N/A 09/02/2017   Procedure: COLONOSCOPY WITH PROPOFOL;  Surgeon: Christena Deem, MD;  Location: [facility] ENDOSCOPY;  Service: Endoscopy;  Laterality: N/A;   INCISE AND DRAIN ABCESS     sebaceous cyst on back   MASTECTOMY  1998   left    Family History: family history includes Breast cancer (age of onset: 24) in her paternal aunt; Cancer in her father and mother.  Social History:  reports that she has never smoked. She has never used smokeless tobacco. She reports current alcohol use. She reports that she does not use drugs.  Current Medications:  Prior to Admission medications   Medication Sig Start Date End Date Taking? Authorizing Provider  amoxicillin-clavulanate (AUGMENTIN) 875-125 MG tablet Take 1 tablet by mouth 2 (two) times daily for 14 days. 06/06/23 06/20/23 Yes Janith Lima, MD  fexofenadine (ALLEGRA) 180 MG tablet Take 180 mg daily by mouth.    [provider]  lansoprazole (PREVACID) 15 MG capsule Take 15 mg daily at 12 noon by mouth.    [provider]  levothyroxine (SYNTHROID, LEVOTHROID) 88 MCG tablet Take 88 mcg by mouth daily.      [provider]  liothyronine (CYTOMEL) 5 MCG tablet Take 5 mcg by mouth daily.      [provider]  Multiple Vitamin (MULTIVITAMIN) tablet Take 1 tablet daily by mouth.    [provider]    Allergies:  Allergies as of 06/06/2023   (No Known Allergies)    ROS:  General: Denies weight loss, weight gain, fatigue, fevers, chills, and night sweats. Eyes: Denies blurry vision, double vision, eye pain, itchy eyes, and tearing. Ears: Denies hearing loss, earache, and ringing in ears. Nose: Denies sinus pain, congestion, infections, runny nose, and nosebleeds. Mouth/throat: Denies hoarseness, sore throat, bleeding gums, and difficulty swallowing. Heart: Denies chest pain, palpitations, racing heart, irregular heartbeat, leg pain or swelling, and decreased activity tolerance. Respiratory: Denies breathing difficulty, shortness of breath, wheezing, cough, and sputum. GI: Denies change in appetite, heartburn, nausea, vomiting, and blood in stool. GU: Denies difficulty urinating, pain with urinating, urgency, frequency, blood in urine. Musculoskeletal: Denies joint stiffness, pain, swelling, muscle weakness. Skin: Denies rash, itching, mass, tumors, sores, and boils Neurologic: Denies headache, fainting, dizziness, seizures, numbness, and tingling. Psychiatric: Denies depression, anxiety, difficulty sleeping, and memory loss. Endocrine: Denies heat or cold intolerance, and increased thirst or urination. Blood/lymph: Denies easy bruising, easy bruising, and swollen glands     Objective:     BP 129/62   Pulse 91   Temp 98 F (36.7 C)   Resp 18   Ht 5\' 1"  (1.549 m)  Wt 75.8 kg   SpO2 99%   BMI 31.55 kg/m   Constitutional :  alert, cooperative, appears stated age, and no distress  Lymphatics/Throat:  no asymmetry, masses, or scars  Respiratory:  clear to  auscultation bilaterally  Cardiovascular:  regular rate and rhythm  Gastrointestinal: Soft, no guarding, minimal tenderness to palpation left lower quadrant .   Musculoskeletal: Steady movement  Skin: Cool and moist   Psychiatric: Normal affect, non-agitated, not confused       LABS:     Latest Ref Rng & Units 06/06/2023   10:57 AM  CMP  Glucose 70 - 99 mg/dL 308   BUN 8 - 23 mg/dL 11   Creatinine 6.57 - 1.00 mg/dL 8.46   Sodium 962 - 952 mmol/L 138   Potassium 3.5 - 5.1 mmol/L 3.5   Chloride 98 - 111 mmol/L 102   CO2 22 - 32 mmol/L 27   Calcium 8.9 - 10.3 mg/dL 8.7   Total Protein 6.5 - 8.1 g/dL 7.4   Total Bilirubin 0.3 - 1.2 mg/dL 0.6   Alkaline Phos 38 - 126 U/L 72   AST 15 - 41 U/L 13   ALT 0 - 44 U/L 13       Latest Ref Rng & Units 06/06/2023   10:57 AM  CBC  WBC 4.0 - 10.5 K/uL 8.6   Hemoglobin 12.0 - 15.0 g/dL 84.1   Hematocrit 32.4 - 46.0 % 34.4   Platelets 150 - 400 K/uL 394     RADS: CLINICAL DATA:  Abdominal pain radiating to the lower abdomen   EXAM: CT ABDOMEN AND PELVIS WITH CONTRAST   TECHNIQUE: Multidetector CT imaging of the abdomen and pelvis was performed using the standard protocol following bolus administration of intravenous contrast.   RADIATION DOSE REDUCTION: This exam was performed according to the departmental dose-optimization program which includes automated exposure control, adjustment of the mA and/or kV according to patient size and/or use of iterative reconstruction technique.   CONTRAST:  OMNIPAQUE IOHEXOL 300 MG/ML  SOLN   COMPARISON:  Report from radiographs dated 05/27/2023   FINDINGS: Lower chest: Left mastectomy. 5 by 2 by 2 mm right middle lobe pulmonary nodule on image 7 series 4. 3 mm right middle lobe nodule, image 12 series 4.   Small type 1 hiatal hernia.   Hepatobiliary: Small benign-appearing vascular shunt anteriorly in the dome of the left hepatic lobe. No further imaging workup of this lesion is  indicated.   Gallbladder unremarkable.  No biliary dilatation.   Pancreas: Unremarkable   Spleen: Unremarkable   Adrenals/Urinary Tract: Mild wall thickening along the posterior urinary bladder is attributable to secondary inflammation from the adjacent paracolic abscess. Otherwise unremarkable.   Stomach/Bowel: Sigmoid colon diverticulosis and diverticulitis. Adjacent 2.7 by 2.1 cm abscess along the anterior-inferior margin of the sigmoid colon, partially abutting the urinary bladder, containing gas and fluid on image 72 series 2. A lateral paracolic abscess in this vicinity measures 2.3 by 1.6 cm on image 69 series 2 and abuts the left adnexa.   No free/uncontained intraperitoneal gas.   Vascular/Lymphatic: Mild abdominal aortic atheromatous vascular calcification.   Reproductive: 2.9 by 2.2 cm simple fluid density right ovarian lesion favoring simple cyst. No follow-up imaging recommended. Note: This recommendation does not apply to premenarchal patients and to those with increased risk (genetic, family history, elevated tumor markers or other high-risk factors) of ovarian cancer. Reference: JACR 2020 Feb; 17(2):248-254   Uterus absent.   Other: No  supplemental non-categorized findings.   Musculoskeletal: Lumbar spondylosis and degenerative disc disease resulting in left foraminal impingement at L5-S1.   IMPRESSION: 1. Acute sigmoid colon diverticulitis with two adjacent paracolic abscesses. Adjacent 2.7 by 2.1 cm abscess along the anterior-inferior margin of the sigmoid colon, partially abutting the urinary bladder. Lateral paracolic abscess measuring 2.3 by 1.6 cm and abutting the left adnexa. 2. Mild wall thickening along the posterior urinary bladder is attributable to secondary inflammation from the adjacent paracolic abscess. 3. Small type 1 hiatal hernia. 4. Lumbar spondylosis and degenerative disc disease resulting in left foraminal impingement at  L5-S1. 5. Two small right middle lobe pulmonary nodules, the largest measuring 5 mm in diameter. No follow-up needed if patient is low-risk.This recommendation follows the consensus statement: Guidelines for Management of Incidental Pulmonary Nodules Detected on CT Images: From the Fleischner Society 2017; Radiology 2017; 284:228-243. 6. Aortic atherosclerosis.   Aortic Atherosclerosis (ICD10-I70.0).     Electronically Signed   By: Gaylyn Rong M.D.   On: 06/05/2023 10:32   Assessment:   Diverticulitis with abscess formation.  Plan:    Patient overall feeling well, no leukocytosis and no evidence of sepsis at this time.  Due to the minor degree of symptoms, recommended outpatient monitoring with Augmentin.  Discussed case with IR and abscess pockets potentially intramural and too small to access for CT-guided drainage.  If symptoms persist or acutely worsens, will need to consider surgical management, but patient clinical status today is reassuring.  Follow-up in 2 weeks in office, sooner if any changes in symptoms.    The patient and family members verbalized understanding and all questions were answered to the patient's satisfaction.  Case discussed with ED provider and he is in agreement as well.  labs/images/medications/previous chart entries reviewed personally and relevant changes/updates noted above.

## 2023-07-18 DIAGNOSIS — Z1401 Asymptomatic hemophilia A carrier: Secondary | ICD-10-CM | POA: Insufficient documentation

## 2023-07-18 DIAGNOSIS — Z803 Family history of malignant neoplasm of breast: Secondary | ICD-10-CM | POA: Insufficient documentation

## 2023-07-18 DIAGNOSIS — E559 Vitamin D deficiency, unspecified: Secondary | ICD-10-CM | POA: Insufficient documentation

## 2023-07-18 DIAGNOSIS — M8588 Other specified disorders of bone density and structure, other site: Secondary | ICD-10-CM | POA: Insufficient documentation

## 2023-07-18 DIAGNOSIS — J309 Allergic rhinitis, unspecified: Secondary | ICD-10-CM | POA: Insufficient documentation

## 2023-07-18 DIAGNOSIS — E063 Autoimmune thyroiditis: Secondary | ICD-10-CM | POA: Insufficient documentation

## 2023-07-18 DIAGNOSIS — M810 Age-related osteoporosis without current pathological fracture: Secondary | ICD-10-CM | POA: Insufficient documentation

## 2023-07-18 DIAGNOSIS — Z9012 Acquired absence of left breast and nipple: Secondary | ICD-10-CM | POA: Insufficient documentation

## 2023-07-18 NOTE — Patient Instructions (Incomplete)

## 2023-07-23 ENCOUNTER — Encounter: Payer: Self-pay | Admitting: Nurse Practitioner

## 2023-07-23 ENCOUNTER — Ambulatory Visit (INDEPENDENT_AMBULATORY_CARE_PROVIDER_SITE_OTHER): Payer: Medicare HMO | Admitting: Nurse Practitioner

## 2023-07-23 VITALS — BP 122/72 | HR 84 | Temp 98.2°F | Ht 62.1 in | Wt 161.6 lb

## 2023-07-23 DIAGNOSIS — Z9012 Acquired absence of left breast and nipple: Secondary | ICD-10-CM

## 2023-07-23 DIAGNOSIS — M810 Age-related osteoporosis without current pathological fracture: Secondary | ICD-10-CM

## 2023-07-23 DIAGNOSIS — E559 Vitamin D deficiency, unspecified: Secondary | ICD-10-CM

## 2023-07-23 DIAGNOSIS — Z7689 Persons encountering health services in other specified circumstances: Secondary | ICD-10-CM

## 2023-07-23 DIAGNOSIS — E063 Autoimmune thyroiditis: Secondary | ICD-10-CM

## 2023-07-23 DIAGNOSIS — F5104 Psychophysiologic insomnia: Secondary | ICD-10-CM

## 2023-07-23 DIAGNOSIS — Z853 Personal history of malignant neoplasm of breast: Secondary | ICD-10-CM

## 2023-07-23 DIAGNOSIS — K219 Gastro-esophageal reflux disease without esophagitis: Secondary | ICD-10-CM | POA: Diagnosis not present

## 2023-07-23 DIAGNOSIS — Z8719 Personal history of other diseases of the digestive system: Secondary | ICD-10-CM | POA: Insufficient documentation

## 2023-07-23 DIAGNOSIS — G4700 Insomnia, unspecified: Secondary | ICD-10-CM | POA: Insufficient documentation

## 2023-07-23 MED ORDER — TRAZODONE HCL 50 MG PO TABS: 25.0000 mg | ORAL_TABLET | Freq: Every evening | ORAL | 3 refills | Status: DC | PRN

## 2023-07-23 NOTE — Progress Notes (Signed)
New Patient Office Visit  Subjective    Patient ID: Charlotte Reyes, female    DOB: 23-May-1951  Age: 72 y.o. MRN: [id number]  CC:  Chief Complaint  Patient presents with   Hypothyroidism   Gastroesophageal Reflux   Insomnia    Patient states she has been having trouble sleeping lately. States she has had trouble falling asleep and staying asleep. States her husband passed away in January 15, 2023 of this year and wonders if this may have something to do with it.     HPI Charlotte Reyes presents for new patient visit to establish care.  Introduced to Publishing rights manager role and practice setting.  All questions answered.  Discussed provider/patient relationship and expectations. Was recommended to come here by another patient in practice.    History of breast cancer with left mastectomy, this was in 1998. Did have chemo and radiation as had metastases to lymph nodes under arm, which they removed.  History of bone density many years ago noting osteoporosis and took Fosamax, she stopped this as dentist told her he would not work on her teeth.  Is not taking Vitamin D.  No recent falls or fractures.  No past fractures.  HYPOTHYROIDISM Continues on Levothyroxine 100 MCG, was diagnosed in the 80's.   Thyroid control status:stable Satisfied with current treatment? yes Medication side effects: no Medication compliance: good compliance Etiology of hypothyroidism: unknown Recent dose adjustment:no Fatigue: yes Cold intolerance: no Heat intolerance: no Weight gain: no Weight loss: no Constipation: two months ago had constipation, took OTC medication which she reports never really worked -- saw GI on August 2024, was diagnosed with diverticulitis with abscesses -- was treated at the time.  Is scheduled colonoscopy in November.  Has had better BM since then, Miralax.  Has had two UTIs since her diverticulitis episode. Diarrhea/loose stools: no Palpitations: no Lower extremity edema:  cramps in  legs recently -- started taking Magnesium, slacked off on it a bit recently .  Notices restlessness and cramping only at night, Magnesium.   Anxiety/depressed mood: no   GERD Continues on Protonix. GERD control status: stable Satisfied with current treatment? yes Heartburn frequency: once a week Medication side effects: no  Medication compliance: fluctuating Previous GERD medications: unknown Antacid use frequency: once a week Dysphagia: had to have stretching in past Odynophagia:  no Hematemesis: no Blood in stool: no EGD: yes   INSOMNIA Ongoing issue husband passed in 01-15-2023 -- had been sick, had been married for 44 years.  He took care of all the bills and insurance.  Has two step-daughters, is close to them. Duration: months Satisfied with sleep quality: no Difficulty falling asleep: yes Difficulty staying asleep: yes Waking a few hours after sleep onset: yes Early morning awakenings: no Daytime hypersomnolence:  at times Wakes feeling refreshed: no Good sleep hygiene: yes Apnea: no Snoring: no Depressed/anxious mood: no Recent stress: yes Restless legs/nocturnal leg cramps: yes Chronic pain/arthritis: no History of sleep study: no Treatments attempted: Melatonin did not work, takes Tylenol PM which helps her get to sleep      07/23/2023   11:15 AM  Depression screen PHQ 2/9  Decreased Interest 0  Down, Depressed, Hopeless 0  PHQ - 2 Score 0  Altered sleeping 1  Tired, decreased energy 1  Change in appetite 0  Feeling bad or failure about yourself  0  Trouble concentrating 0  Moving slowly or fidgety/restless 0  Suicidal thoughts 0  PHQ-9 Score 2  Difficult doing work/chores  Not difficult at all       07/23/2023   11:15 AM  GAD 7 : Generalized Anxiety Score  Nervous, Anxious, on Edge 0  Control/stop worrying 0  Worry too much - different things 0  Trouble relaxing 0  Restless 0  Easily annoyed or irritable 0  Afraid - awful might happen 0  Total GAD 7  Score 0  Anxiety Difficulty Not difficult at all    Outpatient Encounter Medications as of 07/23/2023  Medication Sig   fexofenadine (ALLEGRA) 180 MG tablet Take 180 mg by mouth daily as needed for allergies.   levothyroxine (SYNTHROID) 100 MCG tablet Take 100 mcg by mouth daily before breakfast.   pantoprazole (PROTONIX) 40 MG tablet Take 1 tablet by mouth daily.   traZODone (DESYREL) 50 MG tablet Take 0.5-1 tablets (25-50 mg total) by mouth at bedtime as needed for sleep.   [DISCONTINUED] lansoprazole (PREVACID) 15 MG capsule Take 15 mg daily at 12 noon by mouth.   [DISCONTINUED] meloxicam (MOBIC) 15 MG tablet Take 15 mg by mouth daily as needed for pain.   [DISCONTINUED] Cholecalciferol 25 MCG (1000 UT) TBDP Take 1 tablet by mouth daily at 12 noon.   [DISCONTINUED] levothyroxine (SYNTHROID, LEVOTHROID) 88 MCG tablet Take 88 mcg by mouth daily.     [DISCONTINUED] liothyronine (CYTOMEL) 5 MCG tablet Take 5 mcg by mouth daily.     [DISCONTINUED] Multiple Vitamin (MULTIVITAMIN) tablet Take 1 tablet daily by mouth.   No facility-administered encounter medications on file as of 07/23/2023.    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    Hypothyroidism    Sebaceous cyst    on back   Thyroid disease     Past Surgical History:  Procedure Laterality Date   COLONOSCOPY WITH PROPOFOL N/A 09/02/2017   Procedure: COLONOSCOPY WITH PROPOFOL;  Surgeon: Christena Deem, MD;  Location: [facility] ENDOSCOPY;  Service: Endoscopy;  Laterality: N/A;   INCISE AND DRAIN ABCESS     sebaceous cyst on back   MASTECTOMY  1998   left   TOTAL ABDOMINAL HYSTERECTOMY  1994    Family History  Problem Relation Age of Onset   Cancer Mother        ovarian   Cancer Father        colon   Hemophilia Brother    Clotting disorder  Brother    Breast cancer Paternal Aunt 28    Social History   Socioeconomic History   Marital status: Widowed    Spouse name: Not on file   Number of children: Not on file   Years of education: Not on file   Highest education level: Not on file  Occupational History   Not on file  Tobacco Use   Smoking status: Never   Smokeless tobacco: Never  Vaping Use   Vaping status: Never Used  Substance and Sexual Activity   Alcohol use: Yes    Comment: socially   Drug use: No   Sexual activity: Not Currently  Other Topics Concern   Not on file  Social History Narrative   Not on file   Social Determinants of Health   Financial Resource Strain: Low Risk  (07/23/2023)   Overall Financial Resource Strain (CARDIA)    Difficulty of  Paying Living Expenses: Not hard at all  Food Insecurity: No Food Insecurity (07/23/2023)   Hunger Vital Sign    Worried About Running Out of Food in the Last Year: Never true    Ran Out of Food in the Last Year: Never true  Transportation Needs: No Transportation Needs (07/23/2023)   PRAPARE - Administrator, Civil Service (Medical): No    Lack of Transportation (Non-Medical): No  Physical Activity: Sufficiently Active (07/23/2023)   Exercise Vital Sign    Days of Exercise per Week: 5 days    Minutes of Exercise per Session: 30 min  Stress: No Stress Concern Present (07/23/2023)   [employer] of Occupational Health - Occupational Stress Questionnaire    Feeling of Stress : Not at all  Social Connections: Moderately Isolated (07/23/2023)   Social Connection and Isolation Panel [NHANES]    Frequency of Communication with Friends and Family: More than three times a week    Frequency of Social Gatherings with Friends and Family: More than three times a week    Attends Religious Services: More than 4 times per year    Active Member of [employer] or Organizations: No    Attends Banker Meetings: Never    Marital Status: Widowed   Intimate Partner Violence: Not At Risk (07/23/2023)   Humiliation, Afraid, Rape, and Kick questionnaire    Fear of Current or Ex-Partner: No    Emotionally Abused: No    Physically Abused: No    Sexually Abused: No    Review of Systems  Constitutional:  Negative for diaphoresis, fever, malaise/fatigue and weight loss.  Respiratory:  Negative for cough, shortness of breath and wheezing.   Cardiovascular:  Negative for chest pain, palpitations, orthopnea and leg swelling.  Gastrointestinal:  Positive for heartburn (occasionally).  Neurological: Negative.   Psychiatric/Behavioral:  Negative for depression, memory loss and suicidal ideas. The patient has insomnia. The patient is not nervous/anxious.        Objective    BP 122/72   Pulse 84   Temp 98.2 F (36.8 C) (Oral)   Ht 5' 2.1" (1.577 m)   Wt 161 lb 9.6 oz (73.3 kg)   SpO2 97%   BMI 29.46 kg/m   Physical Exam Vitals and nursing note reviewed.  Constitutional:      General: She is awake. She is not in acute distress.    Appearance: Normal appearance. She is well-developed and well-groomed. She is not ill-appearing or toxic-appearing.  HENT:     Head: Normocephalic.     Right Ear: Hearing and external ear normal.     Left Ear: Hearing and external ear normal.  Eyes:     General: Lids are normal.        Right eye: No discharge.        Left eye: No discharge.     Conjunctiva/sclera: Conjunctivae normal.     Pupils: Pupils are equal, round, and reactive to light.  Neck:     Thyroid: No thyromegaly.     Vascular: No carotid bruit.  Cardiovascular:     Rate and Rhythm: Normal rate and regular rhythm.     Heart sounds: Normal heart sounds. No murmur heard.    No gallop.  Pulmonary:     Effort: Pulmonary effort is normal. No accessory muscle usage or respiratory distress.     Breath sounds: Normal breath sounds.  Abdominal:     General: Bowel sounds are normal. There is no distension.  Palpations: Abdomen is  soft.     Tenderness: There is no abdominal tenderness.  Musculoskeletal:     Cervical back: Normal range of motion and neck supple.     Right lower leg: No edema.     Left lower leg: No edema.  Lymphadenopathy:     Cervical: No cervical adenopathy.  Skin:    General: Skin is warm and dry.  Neurological:     Mental Status: She is alert and oriented to person, place, and time.     Deep Tendon Reflexes: Reflexes are normal and symmetric.     Reflex Scores:      Brachioradialis reflexes are 2+ on the right side and 2+ on the left side.      Patellar reflexes are 2+ on the right side and 2+ on the left side. Psychiatric:        Attention and Perception: Attention normal.        Mood and Affect: Mood normal.        Speech: Speech normal.        Behavior: Behavior normal. Behavior is cooperative.        Thought Content: Thought content normal.       Assessment & Plan:   Problem List Items Addressed This Visit       Digestive   Esophageal reflux    Chronic, stable.  Only occasional heartburn.  Continue Protonix daily and collaboration with GI.  Risks of PPI use were discussed with patient including bone loss, C. Diff diarrhea, pneumonia, infections, CKD, electrolyte abnormalities.  Verbalizes understanding and chooses to continue the medication.  Check Mag level annually.       Relevant Medications   pantoprazole (PROTONIX) 40 MG tablet     Endocrine   Autoimmune lymphocytic chronic thyroiditis - Primary    Chronic, ongoing.  Continue Levothyroxine at current dosing and adjust as needed. Plan on repeat labs at physical in February.      Relevant Medications   levothyroxine (SYNTHROID) 100 MCG tablet     Musculoskeletal and Integument   OP (osteoporosis)    Chronic, no DEXA obtained in some time.  Will order today and instructed her to schedule with her mammogram in November.  Has taken Fosamax in past, but self stopped due to dental needs.  Educated her on alternate  treatments.      Relevant Orders   DG Bone Density     Other   Avitaminosis D    With underlying osteoporosis, is not taking supplement.  Instructed her of need to take this and dosing needs.  Wrote this down for her.  Plan on checking level at physical in February.      History of cancer of left breast    History of with mastectomy and chemo/radiation + removal of some lymph nodes under left axilla in 1998.  Perform BP on right side.  Recommend she schedule annual mammogram.      Relevant Orders   MM 3D SCREENING MAMMOGRAM BILATERAL BREAST   History of mastectomy, left    Refer to breast cancer plan of care.      Relevant Orders   DG Bone Density   Insomnia    After loss of husband in March 2024.  Melatonin offered no benefit.  Advised her not to use Tylenol PM due to Benadryl and BEERS criteria. Will trial a low dose of Trazodone 25-50 MG at night as needed, educated her on this medication and side effects.  This  would be beneficial as no risk of addiction to and can stop use when feels no longer needed + may offer benefit to mood with grieving.  Return in 4 weeks and continue to work on good sleep hygiene.      Other Visit Diagnoses     Encounter to establish care       New patient to office, educated on office setting and provider.       Return in about 4 weeks (around 08/20/2023) for Insomnia -- plus annual physical after 11/29/23.   Marjie Skiff, NP

## 2023-07-23 NOTE — Assessment & Plan Note (Signed)
Chronic, ongoing.  Continue Levothyroxine at current dosing and adjust as needed. Plan on repeat labs at physical in February.

## 2023-07-23 NOTE — Assessment & Plan Note (Signed)
Chronic, stable.  Only occasional heartburn.  Continue Protonix daily and collaboration with GI.  Risks of PPI use were discussed with patient including bone loss, C. Diff diarrhea, pneumonia, infections, CKD, electrolyte abnormalities.  Verbalizes understanding and chooses to continue the medication.  Check Mag level annually.

## 2023-07-23 NOTE — Assessment & Plan Note (Signed)
Chronic, no DEXA obtained in some time.  Will order today and instructed her to schedule with her mammogram in November.  Has taken Fosamax in past, but self stopped due to dental needs.  Educated her on alternate treatments.

## 2023-07-23 NOTE — Assessment & Plan Note (Signed)
History of with mastectomy and chemo/radiation + removal of some lymph nodes under left axilla in 1998.  Perform BP on right side.  Recommend she schedule annual mammogram.

## 2023-07-23 NOTE — Assessment & Plan Note (Signed)
After loss of husband in March 2024.  Melatonin offered no benefit.  Advised her not to use Tylenol PM due to Benadryl and BEERS criteria. Will trial a low dose of Trazodone 25-50 MG at night as needed, educated her on this medication and side effects.  This would be beneficial as no risk of addiction to and can stop use when feels no longer needed + may offer benefit to mood with grieving.  Return in 4 weeks and continue to work on good sleep hygiene.

## 2023-07-23 NOTE — Assessment & Plan Note (Signed)
With underlying osteoporosis, is not taking supplement.  Instructed her of need to take this and dosing needs.  Wrote this down for her.  Plan on checking level at physical in February.

## 2023-07-23 NOTE — Assessment & Plan Note (Signed)
Refer to breast cancer plan of care.

## 2023-08-14 ENCOUNTER — Other Ambulatory Visit: Payer: Self-pay | Admitting: Nurse Practitioner

## 2023-08-14 NOTE — Telephone Encounter (Signed)
Requested Prescriptions  Refused Prescriptions Disp Refills   traZODone (DESYREL) 50 MG tablet [Pharmacy Med Name: TRAZODONE 50 MG TABLET] 90 tablet 2    Sig: TAKE 0.5-1 TABLETS BY MOUTH AT BEDTIME AS NEEDED FOR SLEEP.     Psychiatry: Antidepressants - Serotonin Modulator Passed - 08/14/2023  9:31 AM      Passed - Valid encounter within last 6 months    Recent Outpatient Visits           3 weeks ago Autoimmune lymphocytic chronic thyroiditis   [facility] [place], Dorie Rank, NP       Future Appointments             In 1 week Cannady, Dorie Rank, NP [facility]   In 3 months Kreamer, Dorie Rank, NP [facility], [facility]

## 2023-08-24 NOTE — Patient Instructions (Signed)
Insomnia Insomnia is a sleep disorder that makes it difficult to fall asleep or stay asleep. Insomnia can cause fatigue, low energy, difficulty concentrating, mood swings, and poor performance at work or school. There are three different ways to classify insomnia: Difficulty falling asleep. Difficulty staying asleep. Waking up too early in the morning. Any type of insomnia can be long-term (chronic) or short-term (acute). Both are common. Short-term insomnia usually lasts for 3 months or less. Chronic insomnia occurs at least three times a week for longer than 3 months. What are the causes? Insomnia may be caused by another condition, situation, or substance, such as: Having certain mental health conditions, such as anxiety and depression. Using caffeine, alcohol, tobacco, or drugs. Having gastrointestinal conditions, such as gastroesophageal reflux disease (GERD). Having certain medical conditions. These include: Asthma. Alzheimer's disease. Stroke. Chronic pain. An overactive thyroid gland (hyperthyroidism). Other sleep disorders, such as restless legs syndrome and sleep apnea. Menopause. Sometimes, the cause of insomnia may not be known. What increases the risk? Risk factors for insomnia include: Gender. Females are affected more often than males. Age. Insomnia is more common as people get older. Stress and certain medical and mental health conditions. Lack of exercise. Having an irregular work schedule. This may include working night shifts and traveling between different time zones. What are the signs or symptoms? If you have insomnia, the main symptom is having trouble falling asleep or having trouble staying asleep. This may lead to other symptoms, such as: Feeling tired or having low energy. Feeling nervous about going to sleep. Not feeling rested in the morning. Having trouble concentrating. Feeling irritable, anxious, or depressed. How is this diagnosed? This condition  may be diagnosed based on: Your symptoms and medical history. Your health care provider may ask about: Your sleep habits. Any medical conditions you have. Your mental health. A physical exam. How is this treated? Treatment for insomnia depends on the cause. Treatment may focus on treating an underlying condition that is causing the insomnia. Treatment may also include: Medicines to help you sleep. Counseling or therapy. Lifestyle adjustments to help you sleep better. Follow these instructions at home: Eating and drinking  Limit or avoid alcohol, caffeinated beverages, and products that contain nicotine and tobacco, especially close to bedtime. These can disrupt your sleep. Do not eat a large meal or eat spicy foods right before bedtime. This can lead to digestive discomfort that can make it hard for you to sleep. Sleep habits  Keep a sleep diary to help you and your health care provider figure out what could be causing your insomnia. Write down: When you sleep. When you wake up during the night. How well you sleep and how rested you feel the next day. Any side effects of medicines you are taking. What you eat and drink. Make your bedroom a dark, comfortable place where it is easy to fall asleep. Put up shades or blackout curtains to block light from outside. Use a white noise machine to block noise. Keep the temperature cool. Limit screen use before bedtime. This includes: Not watching TV. Not using your smartphone, tablet, or computer. Stick to a routine that includes going to bed and waking up at the same times every day and night. This can help you fall asleep faster. Consider making a quiet activity, such as reading, part of your nighttime routine. Try to avoid taking naps during the day so that you sleep better at night. Get out of bed if you are still awake after   15 minutes of trying to sleep. Keep the lights down, but try reading or doing a quiet activity. When you feel  sleepy, go back to bed. General instructions Take over-the-counter and prescription medicines only as told by your health care provider. Exercise regularly as told by your health care provider. However, avoid exercising in the hours right before bedtime. Use relaxation techniques to manage stress. Ask your health care provider to suggest some techniques that may work well for you. These may include: Breathing exercises. Routines to release muscle tension. Visualizing peaceful scenes. Make sure that you drive carefully. Do not drive if you feel very sleepy. Keep all follow-up visits. This is important. Contact a health care provider if: You are tired throughout the day. You have trouble in your daily routine due to sleepiness. You continue to have sleep problems, or your sleep problems get worse. Get help right away if: You have thoughts about hurting yourself or someone else. Get help right away if you feel like you may hurt yourself or others, or have thoughts about taking your own life. Go to your nearest emergency room or: Call 911. Call the National Suicide Prevention Lifeline at 1-800-273-8255 or 988. This is open 24 hours a day. Text the Crisis Text Line at 741741. Summary Insomnia is a sleep disorder that makes it difficult to fall asleep or stay asleep. Insomnia can be long-term (chronic) or short-term (acute). Treatment for insomnia depends on the cause. Treatment may focus on treating an underlying condition that is causing the insomnia. Keep a sleep diary to help you and your health care provider figure out what could be causing your insomnia. This information is not intended to replace advice given to you by your health care provider. Make sure you discuss any questions you have with your health care provider. Document Revised: 09/10/2021 Document Reviewed: 09/10/2021 Elsevier Patient Education  2024 Elsevier Inc.  

## 2023-08-26 ENCOUNTER — Encounter: Payer: Self-pay | Admitting: Nurse Practitioner

## 2023-08-26 ENCOUNTER — Other Ambulatory Visit: Payer: Self-pay

## 2023-08-26 ENCOUNTER — Ambulatory Visit (INDEPENDENT_AMBULATORY_CARE_PROVIDER_SITE_OTHER): Payer: Medicare HMO | Admitting: Nurse Practitioner

## 2023-08-26 VITALS — BP 121/69 | HR 84 | Temp 98.4°F | Ht 62.0 in | Wt 162.4 lb

## 2023-08-26 DIAGNOSIS — M810 Age-related osteoporosis without current pathological fracture: Secondary | ICD-10-CM

## 2023-08-26 DIAGNOSIS — Z1159 Encounter for screening for other viral diseases: Secondary | ICD-10-CM

## 2023-08-26 DIAGNOSIS — Z23 Encounter for immunization: Secondary | ICD-10-CM

## 2023-08-26 DIAGNOSIS — E559 Vitamin D deficiency, unspecified: Secondary | ICD-10-CM

## 2023-08-26 DIAGNOSIS — G4700 Insomnia, unspecified: Secondary | ICD-10-CM

## 2023-08-26 DIAGNOSIS — R7301 Impaired fasting glucose: Secondary | ICD-10-CM

## 2023-08-26 DIAGNOSIS — K219 Gastro-esophageal reflux disease without esophagitis: Secondary | ICD-10-CM

## 2023-08-26 DIAGNOSIS — Z0000 Encounter for general adult medical examination without abnormal findings: Secondary | ICD-10-CM | POA: Diagnosis not present

## 2023-08-26 DIAGNOSIS — E063 Autoimmune thyroiditis: Secondary | ICD-10-CM | POA: Diagnosis not present

## 2023-08-26 DIAGNOSIS — Z13220 Encounter for screening for lipoid disorders: Secondary | ICD-10-CM

## 2023-08-26 DIAGNOSIS — F5104 Psychophysiologic insomnia: Secondary | ICD-10-CM

## 2023-08-26 DIAGNOSIS — Z853 Personal history of malignant neoplasm of breast: Secondary | ICD-10-CM

## 2023-08-26 LAB — BAYER DCA HB A1C WAIVED: HB A1C (BAYER DCA - WAIVED): 5.4 % (ref 4.8–5.6)

## 2023-08-26 MED ORDER — BELSOMRA 5 MG PO TABS: 5.0000 mg | ORAL_TABLET | Freq: Every evening | ORAL | 1 refills | Status: DC | PRN

## 2023-08-26 NOTE — Assessment & Plan Note (Signed)
Chronic, stable.  Only occasional heartburn.  Continue Protonix daily and collaboration with GI.  Risks of PPI use were discussed with patient including bone loss, C. Diff diarrhea, pneumonia, infections, CKD, electrolyte abnormalities.  Verbalizes understanding and chooses to continue the medication.  Check Mag level annually.

## 2023-08-26 NOTE — Progress Notes (Signed)
BP 121/69   Pulse 84   Temp 98.4 F (36.9 C) (Oral)   Ht 5\' 2"  (1.575 m)   Wt 162 lb 6.4 oz (73.7 kg)   SpO2 98%   BMI 29.70 kg/m    Subjective:    Patient ID: Charlotte Reyes, female    DOB: Aug 12, 1951, 72 y.o.   MRN: [id number]  HPI: Charlotte Reyes is a 72 y.o. female presenting on 08/26/2023 for Medicare Wellness. Current medical complaints include:none  She currently lives with: self Menopausal Symptoms: no  HYPOTHYROIDISM Continues on Levothyroxine 100 MCG, was diagnosed in the 80's.   Thyroid control status:stable Satisfied with current treatment? yes Medication side effects: no Medication compliance: good compliance Etiology of hypothyroidism: unknown Recent dose adjustment:no Fatigue: yes Cold intolerance: no Heat intolerance: no Weight gain: no Weight loss: no Constipation:no Diarrhea/loose stools: sometimes Palpitations: no Lower extremity edema:  no Anxiety/depressed mood: no    GERD Continues on Protonix. Has been on for many years. GERD control status: stable Satisfied with current treatment? yes Heartburn frequency: occasional Medication side effects: no  Medication compliance: fluctuating Previous GERD medications: unknown Antacid use frequency: once a week Dysphagia: had to have stretching in past Odynophagia:  no Hematemesis: no Blood in stool: no EGD: yes    INSOMNIA Ongoing issue husband passed in March 2024 -- had been ill, had been married for 44 years.  He took care of all the bills and insurance.  Has two step-daughters, is close to them.  Tried Trazodone, 07/23/23, this did not work for her = 1/2 did not work and whole tablet made to sleepy. Duration: months Satisfied with sleep quality: no Difficulty falling asleep: yes Difficulty staying asleep: yes Waking a few hours after sleep onset: yes Early morning awakenings: no Daytime hypersomnolence:  at times Wakes feeling refreshed: no Good sleep hygiene: yes Apnea:  no Snoring: no Depressed/anxious mood: no Recent stress: yes Restless legs/nocturnal leg cramps: yes Chronic pain/arthritis: no History of sleep study: no Treatments attempted: Melatonin did not work, takes Tylenol PM which helps her get to sleep, Trazodone  Depression Screen done today and results listed below:     08/26/2023    1:32 PM 07/23/2023   11:15 AM  Depression screen PHQ 2/9  Decreased Interest 0 0  Down, Depressed, Hopeless 0 0  PHQ - 2 Score 0 0  Altered sleeping 1 1  Tired, decreased energy 0 1  Change in appetite 0 0  Feeling bad or failure about yourself  0 0  Trouble concentrating 0 0  Moving slowly or fidgety/restless 0 0  Suicidal thoughts 0 0  PHQ-9 Score 1 2  Difficult doing work/chores Not difficult at all Not difficult at all      08/26/2023    1:33 PM 07/23/2023   11:15 AM  GAD 7 : Generalized Anxiety Score  Nervous, Anxious, on Edge 0 0  Control/stop worrying 0 0  Worry too much - different things 0 0  Trouble relaxing 0 0  Restless 0 0  Easily annoyed or irritable 0 0  Afraid - awful might happen 0 0  Total GAD 7 Score 0 0  Anxiety Difficulty Not difficult at all Not difficult at all        07/23/2023   11:13 AM 08/26/2023    1:32 PM  Fall Risk  Falls in the past year? 0 0  Was there an injury with Fall? 0 0  Fall Risk Category Calculator 0 0  Patient  at Risk for Falls Due to No Fall Risks No Fall Risks  Fall risk Follow up Falls evaluation completed Falls evaluation completed    Functional Status Survey: Is the patient deaf or have difficulty hearing?: No Does the patient have difficulty seeing, even when wearing glasses/contacts?: No Does the patient have difficulty concentrating, remembering, or making decisions?: No Does the patient have difficulty walking or climbing stairs?: No Does the patient have difficulty dressing or bathing?: No Does the patient have difficulty doing errands alone such as visiting a doctor's office or  shopping?: No   A voluntary discussion about advance care planning including the explanation and discussion of advance directives was extensively discussed  with the patient for 15 minutes with patient  Explanation about the health care proxy and Living will was reviewed and packet with forms with explanation of how to fill them out was given.  During this discussion, the patient was able to identify a health care proxy as grand daughter and plans  to fill out the paperwork required.  Patient was offered a separate Advance Care Planning visit for further assistance with forms.     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    Hypothyroidism    Sebaceous cyst    on back   Thyroid disease     Surgical History:  Past Surgical History:  Procedure Laterality Date   COLONOSCOPY WITH PROPOFOL N/A 09/02/2017   Procedure: COLONOSCOPY WITH PROPOFOL;  Surgeon: Christena Deem, MD;  Location: [facility] ENDOSCOPY;  Service: Endoscopy;  Laterality: N/A;   INCISE AND DRAIN ABCESS     sebaceous cyst on back   MASTECTOMY  1998   left   TOTAL ABDOMINAL HYSTERECTOMY  1994    Medications:  Current Outpatient Medications on File Prior to Visit  Medication Sig   fexofenadine (ALLEGRA) 180 MG tablet Take 180 mg by mouth daily as needed for allergies.   levothyroxine (SYNTHROID) 100 MCG tablet Take 100 mcg by mouth daily before breakfast.   MAGNESIUM PO Take 1 tablet by mouth daily at 2 PM. Patient unsure of dose   pantoprazole (PROTONIX) 40 MG tablet Take 1 tablet by mouth daily.   POTASSIUM PO Take 1 tablet by mouth daily. Patient unsure of dose   TURMERIC PO Take 2 tablets by mouth daily at 2 PM. Patient unsure of dose   No current facility-administered medications on file prior to visit.     Allergies:  Allergies  Allergen Reactions   Amoxicillin-Pot Clavulanate Rash    Rash on abdomen and trunk    Social History:  Social History   Socioeconomic History   Marital status: Widowed    Spouse name: Not on file   Number of children: Not on file   Years of education: Not on file   Highest education level: Bachelor's degree (e.g., BA, AB, BS)  Occupational History   Not on file  Tobacco Use   Smoking status: Never   Smokeless tobacco: Never  Vaping Use   Vaping status: Never Used  Substance and Sexual Activity   Alcohol use: Yes    Comment: socially   Drug use: No   Sexual activity: Not Currently  Other Topics Concern   Not on file  Social  History Narrative   Not on file   Social Determinants of Health   Financial Resource Strain: Low Risk  (08/25/2023)   Overall Financial Resource Strain (CARDIA)    Difficulty of Paying Living Expenses: Not hard at all  Food Insecurity: No Food Insecurity (08/25/2023)   Hunger Vital Sign    Worried About Running Out of Food in the Last Year: Never true    Ran Out of Food in the Last Year: Never true  Transportation Needs: No Transportation Needs (08/25/2023)   PRAPARE - Administrator, Civil Service (Medical): No    Lack of Transportation (Non-Medical): No  Physical Activity: Inactive (08/25/2023)   Exercise Vital Sign    Days of Exercise per Week: 0 days    Minutes of Exercise per Session: 30 min  Stress: No Stress Concern Present (08/25/2023)   [employer] of Occupational Health - Occupational Stress Questionnaire    Feeling of Stress : Only a little  Social Connections: Moderately Integrated (08/25/2023)   Social Connection and Isolation Panel [NHANES]    Frequency of Communication with Friends and Family: More than three times a week    Frequency of Social Gatherings with Friends and Family: More than three times a week    Attends Religious Services: More than 4 times per year    Active  Member of [employer] or Organizations: Yes    Attends Banker Meetings: More than 4 times per year    Marital Status: Widowed  Recent Concern: Social Connections - Moderately Isolated (07/23/2023)   Social Connection and Isolation Panel [NHANES]    Frequency of Communication with Friends and Family: More than three times a week    Frequency of Social Gatherings with Friends and Family: More than three times a week    Attends Religious Services: More than 4 times per year    Active Member of [employer] or Organizations: No    Attends Banker Meetings: Never    Marital Status: Widowed  Intimate Partner Violence: Not At Risk (07/23/2023)   Humiliation, Afraid, Rape, and Kick questionnaire    Fear of Current or Ex-Partner: No    Emotionally Abused: No    Physically Abused: No    Sexually Abused: No   Social History   Tobacco Use  Smoking Status Never  Smokeless Tobacco Never   Social History   Substance and Sexual Activity  Alcohol Use Yes   Comment: socially    Family History:  Family History  Problem Relation Age of Onset   Cancer Mother        ovarian   Cancer Father        colon   Hemophilia Brother    Clotting disorder Brother    Breast cancer Paternal Aunt 30   Past medical history, surgical history, medications, allergies, family history and social history reviewed with patient today and changes made to appropriate areas of the chart.   ROS All other ROS negative except what is listed above and in the HPI.      Objective:    BP 121/69   Pulse 84   Temp 98.4 F (36.9 C) (Oral)   Ht 5\' 2"  (1.575 m)   Wt 162 lb 6.4 oz (73.7 kg)   SpO2 98%   BMI 29.70 kg/m   Wt Readings from Last 3 Encounters:  08/26/23 162 lb 6.4 oz (73.7 kg)  07/23/23 161 lb 9.6 oz (73.3 kg)  06/06/23 167 lb (75.8 kg)  Physical Exam Vitals and nursing note reviewed. Exam conducted with a chaperone present.  Constitutional:      General: She is awake. She is not in  acute distress.    Appearance: She is well-developed and well-groomed. She is not ill-appearing or toxic-appearing.  HENT:     Head: Normocephalic and atraumatic.     Right Ear: Hearing, tympanic membrane, ear canal and external ear normal. No drainage.     Left Ear: Hearing, tympanic membrane, ear canal and external ear normal. No drainage.     Nose: Nose normal.     Right Sinus: No maxillary sinus tenderness or frontal sinus tenderness.     Left Sinus: No maxillary sinus tenderness or frontal sinus tenderness.     Mouth/Throat:     Mouth: Mucous membranes are moist.     Pharynx: Oropharynx is clear. Uvula midline. No pharyngeal swelling, oropharyngeal exudate or posterior oropharyngeal erythema.  Eyes:     General: Lids are normal.        Right eye: No discharge.        Left eye: No discharge.     Extraocular Movements: Extraocular movements intact.     Conjunctiva/sclera: Conjunctivae normal.     Pupils: Pupils are equal, round, and reactive to light.     Visual Fields: Right eye visual fields normal and left eye visual fields normal.  Neck:     Thyroid: No thyromegaly.     Vascular: No carotid bruit.     Trachea: Trachea normal.  Cardiovascular:     Rate and Rhythm: Normal rate and regular rhythm.     Heart sounds: Normal heart sounds. No murmur heard.    No gallop.  Pulmonary:     Effort: Pulmonary effort is normal. No accessory muscle usage or respiratory distress.     Breath sounds: Normal breath sounds.  Abdominal:     General: Bowel sounds are normal.     Palpations: Abdomen is soft. There is no hepatomegaly or splenomegaly.     Tenderness: There is no abdominal tenderness.  Musculoskeletal:        General: Normal range of motion.     Cervical back: Normal range of motion and neck supple.     Right lower leg: No edema.     Left lower leg: No edema.  Lymphadenopathy:     Head:     Right side of head: No submental, submandibular, tonsillar, preauricular or posterior  auricular adenopathy.     Left side of head: No submental, submandibular, tonsillar, preauricular or posterior auricular adenopathy.     Cervical: No cervical adenopathy.  Skin:    General: Skin is warm and dry.     Capillary Refill: Capillary refill takes less than 2 seconds.     Findings: No rash.  Neurological:     Mental Status: She is alert and oriented to person, place, and time.     Gait: Gait is intact.     Deep Tendon Reflexes: Reflexes are normal and symmetric.     Reflex Scores:      Brachioradialis reflexes are 2+ on the right side and 2+ on the left side.      Patellar reflexes are 2+ on the right side and 2+ on the left side. Psychiatric:        Attention and Perception: Attention normal.        Mood and Affect: Mood normal.        Speech: Speech normal.  Behavior: Behavior normal. Behavior is cooperative.        Thought Content: Thought content normal.        Judgment: Judgment normal.       08/26/2023    6:05 PM  6CIT Screen  What Year? 0 points  What month? 0 points  What time? 0 points  Count back from 20 0 points  Months in reverse 0 points  Repeat phrase 0 points  Total Score 0 points   Results for orders placed or performed in visit on 08/26/23  Bayer DCA Hb A1c Waived  Result Value Ref Range   HB A1C (BAYER DCA - WAIVED) 5.4 4.8 - 5.6 %      Assessment & Plan:   Problem List Items Addressed This Visit       Digestive   Esophageal reflux    Chronic, stable.  Only occasional heartburn.  Continue Protonix daily and collaboration with GI.  Risks of PPI use were discussed with patient including bone loss, C. Diff diarrhea, pneumonia, infections, CKD, electrolyte abnormalities.  Verbalizes understanding and chooses to continue the medication.  Check Mag level annually.       Relevant Orders   Magnesium     Endocrine   Autoimmune lymphocytic chronic thyroiditis    Chronic, ongoing.  Continue Levothyroxine at current dosing and adjust as  needed. Labs today.      Relevant Orders   CBC with Differential/Platelet   TSH     Other   Avitaminosis D    With underlying osteoporosis, is not taking supplement.  Instructed her of need to take this and dosing needs.  Wrote this down for her.  Vitamin D level today.      Relevant Orders   VITAMIN D 25 Hydroxy (Vit-D Deficiency, Fractures)   Insomnia    After loss of husband in March 2024.  Melatonin offered no benefit and Trazodone did not work at low dose, but at next dose up made her too groggy.  Advised her not to use Tylenol PM due to Benadryl and BEERS criteria. Will see if Belsomra covered, not on BEERS at present, start at low dose 5 MG.  Educated her on this medication.  If not covered could try Lunesta, but prefer Belsomra.  Avoid Ambien.  Return in 4 weeks and continue to work on good sleep hygiene.      Other Visit Diagnoses     Medicare annual wellness visit, subsequent    -  Primary   Medicare wellness due and performed with patient today.   IFG (impaired fasting glucose)       A1c obtained today, last glucose was 158.   Relevant Orders   Bayer DCA Hb A1c Waived (Completed)   Encounter for lipid screening for cardiovascular disease       Lipid panel in office today.   Relevant Orders   Comprehensive metabolic panel   Lipid Panel w/o Chol/HDL Ratio   Need for hepatitis C screening test       Hep C screening due and performed in office today.   Relevant Orders   Hepatitis C antibody   Need for pneumococcal 20-valent conjugate vaccination       PCV20 today, educated on this.   Relevant Orders   Pneumococcal conjugate vaccine 20-valent (Prevnar 20) (Completed)        Follow up plan: Return in about 4 weeks (around 09/23/2023) for Insomnia.  LABORATORY TESTING:  - Pap smear: not applicable  IMMUNIZATIONS:   -  Tdap: Tetanus vaccination status reviewed: refused today. - Influenza: Up to date - Pneumovax: Not applicable - Prevnar: Up to date - COVID: Up  to date - HPV: Not applicable - Shingrix vaccine: Refused  SCREENING: -Mammogram:  scheduled for 11/06/23   - Colonoscopy: Up to date  - Bone Density:  scheduled for 11/06/23   -Hearing Test: Not applicable  -Spirometry: Not applicable   PATIENT COUNSELING:   Advised to take 1 mg of folate supplement per day if capable of pregnancy.   Sexuality: Discussed sexually transmitted diseases, partner selection, use of condoms, avoidance of unintended pregnancy  and contraceptive alternatives.   Advised to avoid cigarette smoking.  I discussed with the patient that most people either abstain from alcohol or drink within safe limits (<=14/week and <=4 drinks/occasion for males, <=7/weeks and <= 3 drinks/occasion for females) and that the risk for alcohol disorders and other health effects rises proportionally with the number of drinks per week and how often a drinker exceeds daily limits.  Discussed cessation/primary prevention of drug use and availability of treatment for abuse.   Diet: Encouraged to adjust caloric intake to maintain  or achieve ideal body weight, to reduce intake of dietary saturated fat and total fat, to limit sodium intake by avoiding high sodium foods and not adding table salt, and to maintain adequate dietary potassium and calcium preferably from fresh fruits, vegetables, and low-fat dairy products.    Stressed the importance of regular exercise  Injury prevention: Discussed safety belts, safety helmets, smoke detector, smoking near bedding or upholstery.   Dental health: Discussed importance of regular tooth brushing, flossing, and dental visits.    NEXT PREVENTATIVE PHYSICAL DUE IN 1 YEAR. Return in about 4 weeks (around 09/23/2023) for Insomnia.

## 2023-08-26 NOTE — Assessment & Plan Note (Signed)
After loss of husband in March 2024.  Melatonin offered no benefit and Trazodone did not work at low dose, but at next dose up made her too groggy.  Advised her not to use Tylenol PM due to Benadryl and BEERS criteria. Will see if Belsomra covered, not on BEERS at present, start at low dose 5 MG.  Educated her on this medication.  If not covered could try Lunesta, but prefer Belsomra.  Avoid Ambien.  Return in 4 weeks and continue to work on good sleep hygiene.

## 2023-08-26 NOTE — Assessment & Plan Note (Signed)
Chronic, ongoing.  Continue Levothyroxine at current dosing and adjust as needed.  Labs today. 

## 2023-08-26 NOTE — Assessment & Plan Note (Signed)
With underlying osteoporosis, is not taking supplement.  Instructed her of need to take this and dosing needs.  Wrote this down for her.  Vitamin D level today.

## 2023-08-27 LAB — TSH: TSH: 1.01 u[IU]/mL (ref 0.450–4.500)

## 2023-08-27 LAB — COMPREHENSIVE METABOLIC PANEL
ALT: 17 [IU]/L (ref 0–32)
AST: 15 [IU]/L (ref 0–40)
Albumin: 4.3 g/dL (ref 3.8–4.8)
Alkaline Phosphatase: 86 [IU]/L (ref 44–121)
BUN/Creatinine Ratio: 15 (ref 12–28)
BUN: 13 mg/dL (ref 8–27)
Bilirubin Total: <0.2 mg/dL (ref 0.0–1.2)
CO2: 25 mmol/L (ref 20–29)
Calcium: 9.3 mg/dL (ref 8.7–10.3)
Chloride: 103 mmol/L (ref 96–106)
Creatinine, Ser: 0.85 mg/dL (ref 0.57–1.00)
Globulin, Total: 2.5 g/dL (ref 1.5–4.5)
Glucose: 91 mg/dL (ref 70–99)
Potassium: 4.5 mmol/L (ref 3.5–5.2)
Sodium: 142 mmol/L (ref 134–144)
Total Protein: 6.8 g/dL (ref 6.0–8.5)
eGFR: 72 mL/min/{1.73_m2} (ref >59)

## 2023-08-27 LAB — CBC WITH DIFFERENTIAL/PLATELET
Basophils Absolute: 0.1 10*3/uL (ref 0.0–0.2)
Basos: 1 %
EOS (ABSOLUTE): 0.2 10*3/uL (ref 0.0–0.4)
Eos: 3 %
Hematocrit: 38 % (ref 34.0–46.6)
Hemoglobin: 12.4 g/dL (ref 11.1–15.9)
Immature Grans (Abs): 0 10*3/uL (ref 0.0–0.1)
Immature Granulocytes: 0 %
Lymphocytes Absolute: 1.5 10*3/uL (ref 0.7–3.1)
Lymphs: 29 %
MCH: 30.8 pg (ref 26.6–33.0)
MCHC: 32.6 g/dL (ref 31.5–35.7)
MCV: 95 fL (ref 79–97)
Monocytes Absolute: 0.5 10*3/uL (ref 0.1–0.9)
Monocytes: 9 %
Neutrophils Absolute: 2.9 10*3/uL (ref 1.4–7.0)
Neutrophils: 58 %
Platelets: 270 10*3/uL (ref 150–450)
RBC: 4.02 x10E6/uL (ref 3.77–5.28)
RDW: 13.3 % (ref 11.7–15.4)
WBC: 5.1 10*3/uL (ref 3.4–10.8)

## 2023-08-27 LAB — LIPID PANEL W/O CHOL/HDL RATIO
Cholesterol, Total: 234 mg/dL — ABNORMAL HIGH (ref 100–199)
HDL: 67 mg/dL (ref >39)
LDL Chol Calc (NIH): 140 mg/dL — ABNORMAL HIGH (ref 0–99)
Triglycerides: 155 mg/dL — ABNORMAL HIGH (ref 0–149)
VLDL Cholesterol Cal: 27 mg/dL (ref 5–40)

## 2023-08-27 LAB — MAGNESIUM: Magnesium: 2.2 mg/dL (ref 1.6–2.3)

## 2023-08-27 LAB — HEPATITIS C ANTIBODY: Hep C Virus Ab: NONREACTIVE

## 2023-08-27 LAB — VITAMIN D 25 HYDROXY (VIT D DEFICIENCY, FRACTURES): Vit D, 25-Hydroxy: 27.3 ng/mL — ABNORMAL LOW (ref 30.0–100.0)

## 2023-08-27 NOTE — Progress Notes (Signed)
Contacted via MyChart The 10-year ASCVD risk score (Arnett DK, et al., 2019) is: 11.9%   Values used to calculate the score:     Age: 72 years     Sex: Female     Is Non-Hispanic African American: No     Diabetic: No     Tobacco smoker: No     Systolic Blood Pressure: 121 mmHg     Is BP treated: No     HDL Cholesterol: 67 mg/dL     Total Cholesterol: 234 mg/dL    Good afternoon Charlotte Reyes, your labs have returned and overall remain stable with exception of Vitamin D level which remains a bit low.  Ensure you are taking Vitamin D3 2000 units daily.  For cholesterol I would recommend a low dose of medication as your risk for stroke or heart attack is a bit elevated.  Would you like to try a low dose of Rosuvastatin 10 MG?  Let me know.  Any questions?   Keep being amazing!!  Thank you for allowing me to participate in your care.  I appreciate you. Kindest regards, Savier Trickett

## 2023-09-01 ENCOUNTER — Telehealth: Payer: Self-pay | Admitting: Nurse Practitioner

## 2023-09-01 NOTE — Telephone Encounter (Signed)
Maralyn Sago calling from [facility] calling to report that the Suvorexant (BELSOMRA) 5 MG TABS [[id number]] is not formulary PA would be needed Preferred medication is Daybio. Please send new script or PA on the medication. [phone]

## 2023-09-02 ENCOUNTER — Encounter
Admission: RE | Disposition: A | Discharge status: Home / Self Care | Payer: Self-pay | Source: Home / Self Care | Attending: Gastroenterology

## 2023-09-02 ENCOUNTER — Ambulatory Visit: Payer: Medicare HMO | Admitting: Anesthesiology

## 2023-09-02 ENCOUNTER — Ambulatory Visit
Admission: RE | Admit: 2023-09-02 | Discharge: 2023-09-02 | Disposition: A | Discharge status: Home / Self Care | Payer: Medicare HMO | Attending: Gastroenterology | Admitting: Gastroenterology

## 2023-09-02 DIAGNOSIS — K648 Other hemorrhoids: Secondary | ICD-10-CM | POA: Diagnosis not present

## 2023-09-02 DIAGNOSIS — Z90710 Acquired absence of both cervix and uterus: Secondary | ICD-10-CM | POA: Insufficient documentation

## 2023-09-02 DIAGNOSIS — K56699 Other intestinal obstruction unspecified as to partial versus complete obstruction: Secondary | ICD-10-CM | POA: Diagnosis present

## 2023-09-02 DIAGNOSIS — K5730 Diverticulosis of large intestine without perforation or abscess without bleeding: Secondary | ICD-10-CM | POA: Diagnosis not present

## 2023-09-02 DIAGNOSIS — K640 First degree hemorrhoids: Secondary | ICD-10-CM | POA: Diagnosis not present

## 2023-09-02 DIAGNOSIS — Z800 Family history of malignant neoplasm of digestive organs: Secondary | ICD-10-CM | POA: Insufficient documentation

## 2023-09-02 DIAGNOSIS — E039 Hypothyroidism, unspecified: Secondary | ICD-10-CM | POA: Diagnosis not present

## 2023-09-02 HISTORY — PX: COLONOSCOPY WITH PROPOFOL: SHX5780

## 2023-09-02 SURGERY — COLONOSCOPY WITH PROPOFOL
Anesthesia: General

## 2023-09-02 MED ORDER — PROPOFOL 10 MG/ML IV BOLUS
INTRAVENOUS | Status: DC | PRN
  Administered 2023-09-02: 50 mg via INTRAVENOUS
  Administered 2023-09-02: 20 mg via INTRAVENOUS
  Administered 2023-09-02: 30 mg via INTRAVENOUS

## 2023-09-02 MED ORDER — LIDOCAINE HCL (PF) 2 % IJ SOLN
INTRAMUSCULAR | Status: AC
  Filled 2023-09-02: qty 5

## 2023-09-02 MED ORDER — PROPOFOL 500 MG/50ML IV EMUL
INTRAVENOUS | Status: DC | PRN
  Administered 2023-09-02: 75 ug/kg/min via INTRAVENOUS

## 2023-09-02 MED ORDER — SODIUM CHLORIDE 0.9 % IV SOLN: INTRAVENOUS | Status: DC

## 2023-09-02 MED ORDER — PROPOFOL 1000 MG/100ML IV EMUL
INTRAVENOUS | Status: AC
Start: 2023-09-02 — End: ?
  Filled 2023-09-02: qty 100

## 2023-09-02 MED ORDER — LIDOCAINE HCL (CARDIAC) PF 100 MG/5ML IV SOSY
PREFILLED_SYRINGE | INTRAVENOUS | Status: DC | PRN
  Administered 2023-09-02: 60 mg via INTRAVENOUS

## 2023-09-02 NOTE — Anesthesia Postprocedure Evaluation (Signed)
Anesthesia Post Note  Patient: Terressa Hutzell  Procedure(s) Performed: COLONOSCOPY WITH PROPOFOL  Patient location during evaluation: PACU Anesthesia Type: General Level of consciousness: awake and alert Pain management: pain level controlled Vital Signs Assessment: post-procedure vital signs reviewed and stable Respiratory status: spontaneous breathing, nonlabored ventilation, respiratory function stable and patient connected to nasal cannula oxygen Cardiovascular status: blood pressure returned to baseline and stable Postop Assessment: no apparent nausea or vomiting Anesthetic complications: no   No notable events documented.   Last Vitals:  Vitals:   09/02/23 0852 09/02/23 0902  BP: 93/74 (!) 103/59  Pulse: 73   Resp: 16   Temp: 36.4 C   SpO2: 100%     Last Pain:  Vitals:   09/02/23 0912  TempSrc:   PainSc: 0-No pain                 Yevette Edwards

## 2023-09-02 NOTE — Transfer of Care (Signed)
Immediate Anesthesia Transfer of Care Note  Patient: Charlotte Reyes  Procedure(s) Performed: COLONOSCOPY WITH PROPOFOL  Patient Location: PACU  Anesthesia Type:General  Level of Consciousness: sedated  Airway & Oxygen Therapy: Patient Spontanous Breathing and Patient connected to nasal cannula oxygen  Post-op Assessment: Report given to RN and Post -op Vital signs reviewed and stable  Post vital signs: Reviewed and stable  Last Vitals:  Vitals Value Taken Time  BP 93/74 09/02/23 0852  Temp    Pulse 74 09/02/23 0852  Resp 18 09/02/23 0852  SpO2 99 % 09/02/23 0852  Vitals shown include unfiled device data.  Last Pain:  Vitals:   09/02/23 0728  TempSrc: Temporal  PainSc: 0-No pain         Complications: No notable events documented.

## 2023-09-02 NOTE — Telephone Encounter (Signed)
PA for Belsomra initiated and submitted via Cover My Meds. Key: [id number]

## 2023-09-02 NOTE — H&P (Signed)
Outpatient short stay form Pre-procedure 09/02/2023  Regis Bill, MD  Primary Physician: Marjie Skiff, NP  Reason for visit:  Follow-up of diverticulitis  History of present illness:    72 y/o lady with history of hypothyroidism here for colonoscopy for recent history of diverticulitis, > 6 weeks ago. No blood thinners. Father with colon cancer around 73. History of hysterectomy.    Current Facility-Administered Medications:    0.9 %  sodium chloride infusion, , Intravenous, Continuous, Midge Momon, Rossie Muskrat, MD, Last Rate: 20 mL/hr at 09/02/23 0741, New Bag at 09/02/23 0741  Medications Prior to Admission  Medication Sig Dispense Refill Last Dose   levothyroxine (SYNTHROID) 100 MCG tablet Take 100 mcg by mouth daily before breakfast.   09/02/2023   fexofenadine (ALLEGRA) 180 MG tablet Take 180 mg by mouth daily as needed for allergies.      MAGNESIUM PO Take 1 tablet by mouth daily at 2 PM. Patient unsure of dose      pantoprazole (PROTONIX) 40 MG tablet Take 1 tablet by mouth daily.      POTASSIUM PO Take 1 tablet by mouth daily. Patient unsure of dose      Suvorexant (BELSOMRA) 5 MG TABS Take 1 tablet (5 mg total) by mouth at bedtime as needed. 30 tablet 1    TURMERIC PO Take 2 tablets by mouth daily at 2 PM. 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    Physical Exam  Gen: Alert, oriented. Appears stated age.  HEENT: PERRLA. Lungs: No respiratory distress CV: RRR Abd: soft, benign, no masses Ext: No edema    Planned procedures:  Proceed with colonoscopy. The patient understands the nature of the planned procedure, indications, risks, alternatives and potential complications including but not limited to bleeding, infection, perforation, damage to internal organs and possible oversedation/side effects from anesthesia. The patient agrees and gives consent to proceed.  Please refer to procedure notes for findings, recommendations and patient disposition/instructions.     Regis Bill, MD [facility]astroenter[facility]

## 2023-09-02 NOTE — Op Note (Addendum)
[facility]astroenter[facility] Patient Name: Charlotte Reyes Procedure Date: 09/02/2023 8:19 AM MRN: [id number] Account #: [id number] Date of Birth: 11-24-1950 Admit Type: Outpatient Age: 72 Room: [facility] ENDO ROOM 1 Gender: Female Note Status: Supervisor Override Instrument Name: Nelda Marseille [id number] Procedure:             Colonoscopy Indications:           Lower abdominal pain, Follow-up of diverticulitis,                         Change in bowel habits Providers:             Eather Colas MD, MD Referring MD:          Dorie Rank. Harvest Dark (Referring MD) Medicines:             Monitored Anesthesia Care Complications:         No immediate complications. Procedure:             Pre-Anesthesia Assessment:                        - Prior to the procedure, a History and Physical was                         performed, and patient medications and allergies were                         reviewed. The patient is competent. The risks and                         benefits of the procedure and the sedation options and                         risks were discussed with the patient. All questions                         were answered and informed consent was obtained.                         Patient identification and proposed procedure were                         verified by the physician, the nurse, the                         anesthesiologist, the anesthetist and the technician                         in the endoscopy suite. Mental Status Examination:                         alert and oriented. Airway Examination: normal                         oropharyngeal airway and neck mobility. Respiratory                         Examination: clear to auscultation. CV Examination:  normal. Prophylactic Antibiotics: The patient does not                         require prophylactic antibiotics. Prior                         Anticoagulants: The patient has taken no  anticoagulant                         or antiplatelet agents. ASA Grade Assessment: II - A                         patient with mild systemic disease. After reviewing                         the risks and benefits, the patient was deemed in                         satisfactory condition to undergo the procedure. The                         anesthesia plan was to use monitored anesthesia care                         (MAC). Immediately prior to administration of                         medications, the patient was re-assessed for adequacy                         to receive sedatives. The heart rate, respiratory                         rate, oxygen saturations, blood pressure, adequacy of                         pulmonary ventilation, and response to care were                         monitored throughout the procedure. The physical                         status of the patient was re-assessed after the                         procedure.                        After obtaining informed consent, the colonoscope was                         passed under direct vision. Throughout the procedure,                         the patient's blood pressure, pulse, and oxygen                         saturations were monitored continuously. The  Colonoscope was introduced through the anus with the                         intention of advancing to the cecum. The scope was                         advanced to the sigmoid colon before the procedure was                         aborted. Medications were given. The colonoscopy was                         aborted due to multiple diverticula in the colon and                         restricted mobility of the colon. Withdrawing the                         scope and replacing with the adult endoscope did not                         allow for the successful completion of the procedure.                         The patient tolerated the procedure well.  The quality                         of the bowel preparation was adequate to identify                         polyps. Findings:      The perianal and digital rectal examinations were normal.      A benign-appearing, intrinsic severe stenosis was found in the sigmoid       colon and was non-traversed. This is likely secondary to the many       diverticula in the area.      Many small-mouthed diverticula were found in the sigmoid colon.      Internal hemorrhoids were found during retroflexion. The hemorrhoids       were Grade I (internal hemorrhoids that do not prolapse).      The exam was otherwise without abnormality on direct and retroflexion       views. Impression:            - The procedure was aborted due to multiple                         diverticula in the colon and restricted mobility of                         the colon.                        - Stricture in the sigmoid colon.                        - Diverticulosis in the sigmoid colon.                        -  Internal hemorrhoids.                        - The examination was otherwise normal on direct and                         retroflexion views.                        - No specimens collected. Recommendation:        - Discharge patient to home.                        - Resume previous diet.                        - Continue present medications.                        - Perform a virtual colonoscopy at appointment to be                         scheduled.                        - Return to referring physician as previously                         scheduled. Procedure Code(s):     --- Professional ---                        [phone], 53, Colonoscopy, flexible; diagnostic,                         including collection of specimen(s) by brushing or                         washing, when performed (separate procedure) Diagnosis Code(s):     --- Professional ---                        K64.0, First degree hemorrhoids                         K56.699, Other intestinal obstruction unspecified as                         to partial versus complete obstruction                        K57.32, Diverticulitis of large intestine without                         perforation or abscess without bleeding                        K57.30, Diverticulosis of large intestine without                         perforation or abscess without bleeding CPT copyright 2022 American Medical Association. All rights reserved. The codes documented in this report are preliminary and upon coder review may  be revised to  meet current compliance requirements. Eather Colas MD, MD 09/02/2023 8:52:43 AM Number of Addenda: 0 Note Initiated On: 09/02/2023 8:19 AM Total Procedure Duration: 0 hours 19 minutes 14 seconds  Estimated Blood Loss:  Estimated blood loss: none.      [facility]

## 2023-09-02 NOTE — Anesthesia Preprocedure Evaluation (Signed)
Anesthesia Evaluation  Patient identified by MRN, date of birth, ID band Patient awake    Reviewed: Allergy & Precautions, H&P , NPO status , Patient's Chart, lab work & pertinent test results, reviewed documented beta blocker date and time   Airway Mallampati: II   Neck ROM: full    Dental  (+) Poor Dentition   Pulmonary neg pulmonary ROS   Pulmonary exam normal        Cardiovascular Exercise Tolerance: Good negative cardio ROS Normal cardiovascular exam Rhythm:regular Rate:Normal     Neuro/Psych  Headaches  negative psych ROS   GI/Hepatic Neg liver ROS,GERD  Medicated,,  Endo/Other  Hypothyroidism    Renal/GU negative Renal ROS  negative genitourinary   Musculoskeletal   Abdominal   Peds  Hematology negative hematology ROS (+)   Anesthesia Other Findings Past Medical History: 1998: Breast cancer (HCC)     Comment:  left No date: Cancer (HCC) No date: Diverticulitis No date: Generalized headaches     Comment:  may be due to stress No date: GERD (gastroesophageal reflux disease) 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 No date: Hypothyroidism No date: Sebaceous cyst     Comment:  on back No date: Thyroid disease Past Surgical History: 09/02/2017: COLONOSCOPY WITH PROPOFOL; N/A     Comment:  Procedure: COLONOSCOPY WITH PROPOFOL;  Surgeon:               Christena Deem, MD;  Location: [facility] ENDOSCOPY;                Service: Endoscopy;  Laterality: N/A; No date: INCISE AND DRAIN ABCESS     Comment:  sebaceous cyst on back 1998: MASTECTOMY     Comment:  left 1994: TOTAL ABDOMINAL HYSTERECTOMY BMI    Body Mass Index: 29.45 kg/m     Reproductive/Obstetrics negative OB ROS                             Anesthesia Physical Anesthesia Plan  ASA: 2  Anesthesia Plan: General   Post-op Pain Management:    Induction:   PONV Risk Score and Plan:   Airway Management Planned:    Additional Equipment:   Intra-op Plan:   Post-operative Plan:   Informed Consent: I have reviewed the patients History and Physical, chart, labs and discussed the procedure including the risks, benefits and alternatives for the proposed anesthesia with the patient or authorized representative who has indicated his/her understanding and acceptance.     Dental Advisory Given  Plan Discussed with: CRNA  Anesthesia Plan Comments:        Anesthesia Quick Evaluation

## 2023-09-02 NOTE — Interval H&P Note (Signed)
History and Physical Interval Note:  09/02/2023 8:22 AM  Charlotte Reyes  has presented today for surgery, with the diagnosis of abdomen pain, bowel habit changes,constipation.  The various methods of treatment have been discussed with the patient and family. After consideration of risks, benefits and other options for treatment, the patient has consented to  Procedure(s): COLONOSCOPY WITH PROPOFOL (N/A) as a surgical intervention.  The patient's history has been reviewed, patient examined, no change in status, stable for surgery.  I have reviewed the patient's chart and labs.  Questions were answered to the patient's satisfaction.     Regis Bill  Ok to proceed with colonoscopy

## 2023-09-03 ENCOUNTER — Encounter: Payer: Self-pay | Admitting: Gastroenterology

## 2023-09-03 NOTE — Telephone Encounter (Signed)
PA has been denied. Preferred alternatives are Dayvigo, Doxepin, Zolpidem, or Temazepam.

## 2023-09-09 ENCOUNTER — Other Ambulatory Visit: Payer: Medicare HMO

## 2023-09-18 ENCOUNTER — Other Ambulatory Visit: Payer: Self-pay | Admitting: Gastroenterology

## 2023-09-18 DIAGNOSIS — K56699 Other intestinal obstruction unspecified as to partial versus complete obstruction: Secondary | ICD-10-CM

## 2023-09-19 ENCOUNTER — Encounter: Payer: Self-pay | Admitting: Gastroenterology

## 2023-09-20 NOTE — Patient Instructions (Incomplete)
Insomnia Insomnia is a sleep disorder that makes it difficult to fall asleep or stay asleep. Insomnia can cause fatigue, low energy, difficulty concentrating, mood swings, and poor performance at work or school. There are three different ways to classify insomnia: Difficulty falling asleep. Difficulty staying asleep. Waking up too early in the morning. Any type of insomnia can be long-term (chronic) or short-term (acute). Both are common. Short-term insomnia usually lasts for 3 months or less. Chronic insomnia occurs at least three times a week for longer than 3 months. What are the causes? Insomnia may be caused by another condition, situation, or substance, such as: Having certain mental health conditions, such as anxiety and depression. Using caffeine, alcohol, tobacco, or drugs. Having gastrointestinal conditions, such as gastroesophageal reflux disease (GERD). Having certain medical conditions. These include: Asthma. Alzheimer's disease. Stroke. Chronic pain. An overactive thyroid gland (hyperthyroidism). Other sleep disorders, such as restless legs syndrome and sleep apnea. Menopause. Sometimes, the cause of insomnia may not be known. What increases the risk? Risk factors for insomnia include: Gender. Females are affected more often than males. Age. Insomnia is more common as people get older. Stress and certain medical and mental health conditions. Lack of exercise. Having an irregular work schedule. This may include working night shifts and traveling between different time zones. What are the signs or symptoms? If you have insomnia, the main symptom is having trouble falling asleep or having trouble staying asleep. This may lead to other symptoms, such as: Feeling tired or having low energy. Feeling nervous about going to sleep. Not feeling rested in the morning. Having trouble concentrating. Feeling irritable, anxious, or depressed. How is this diagnosed? This condition  may be diagnosed based on: Your symptoms and medical history. Your health care provider may ask about: Your sleep habits. Any medical conditions you have. Your mental health. A physical exam. How is this treated? Treatment for insomnia depends on the cause. Treatment may focus on treating an underlying condition that is causing the insomnia. Treatment may also include: Medicines to help you sleep. Counseling or therapy. Lifestyle adjustments to help you sleep better. Follow these instructions at home: Eating and drinking  Limit or avoid alcohol, caffeinated beverages, and products that contain nicotine and tobacco, especially close to bedtime. These can disrupt your sleep. Do not eat a large meal or eat spicy foods right before bedtime. This can lead to digestive discomfort that can make it hard for you to sleep. Sleep habits  Keep a sleep diary to help you and your health care provider figure out what could be causing your insomnia. Write down: When you sleep. When you wake up during the night. How well you sleep and how rested you feel the next day. Any side effects of medicines you are taking. What you eat and drink. Make your bedroom a dark, comfortable place where it is easy to fall asleep. Put up shades or blackout curtains to block light from outside. Use a white noise machine to block noise. Keep the temperature cool. Limit screen use before bedtime. This includes: Not watching TV. Not using your smartphone, tablet, or computer. Stick to a routine that includes going to bed and waking up at the same times every day and night. This can help you fall asleep faster. Consider making a quiet activity, such as reading, part of your nighttime routine. Try to avoid taking naps during the day so that you sleep better at night. Get out of bed if you are still awake after   15 minutes of trying to sleep. Keep the lights down, but try reading or doing a quiet activity. When you feel  sleepy, go back to bed. General instructions Take over-the-counter and prescription medicines only as told by your health care provider. Exercise regularly as told by your health care provider. However, avoid exercising in the hours right before bedtime. Use relaxation techniques to manage stress. Ask your health care provider to suggest some techniques that may work well for you. These may include: Breathing exercises. Routines to release muscle tension. Visualizing peaceful scenes. Make sure that you drive carefully. Do not drive if you feel very sleepy. Keep all follow-up visits. This is important. Contact a health care provider if: You are tired throughout the day. You have trouble in your daily routine due to sleepiness. You continue to have sleep problems, or your sleep problems get worse. Get help right away if: You have thoughts about hurting yourself or someone else. Get help right away if you feel like you may hurt yourself or others, or have thoughts about taking your own life. Go to your nearest emergency room or: Call 911. Call the National Suicide Prevention Lifeline at 1-800-273-8255 or 988. This is open 24 hours a day. Text the Crisis Text Line at 741741. Summary Insomnia is a sleep disorder that makes it difficult to fall asleep or stay asleep. Insomnia can be long-term (chronic) or short-term (acute). Treatment for insomnia depends on the cause. Treatment may focus on treating an underlying condition that is causing the insomnia. Keep a sleep diary to help you and your health care provider figure out what could be causing your insomnia. This information is not intended to replace advice given to you by your health care provider. Make sure you discuss any questions you have with your health care provider. Document Revised: 09/10/2021 Document Reviewed: 09/10/2021 Elsevier Patient Education  2024 [employer]  

## 2023-09-23 ENCOUNTER — Ambulatory Visit: Payer: Medicare HMO | Admitting: Nurse Practitioner

## 2023-09-27 NOTE — Patient Instructions (Signed)
Be Involved in Caring For Your Health:  Taking Medications When medications are taken as directed, they can greatly improve your health. But if they are not taken as prescribed, they may not work. In some cases, not taking them correctly can be harmful. To help ensure your treatment remains effective and safe, understand your medications and how to take them. Bring your medications to each visit for review by your provider.  Your lab results, notes, and after visit summary will be available on My Chart. We strongly encourage you to use this feature. If lab results are abnormal the clinic will contact you with the appropriate steps. If the clinic does not contact you assume the results are satisfactory. You can always view your results on My Chart. If you have questions regarding your health or results, please contact the clinic during office hours. You can also ask questions on My Chart.  We at [facility] are grateful that you chose [place] to provide your care. We strive to provide evidence-based and compassionate care and are always looking for feedback. If you get a survey from the clinic please complete this so we can hear your opinions.  Insomnia Insomnia is a sleep disorder that makes it difficult to fall asleep or stay asleep. Insomnia can cause fatigue, low energy, difficulty concentrating, mood swings, and poor performance at work or school. There are three different ways to classify insomnia: Difficulty falling asleep. Difficulty staying asleep. Waking up too early in the morning. Any type of insomnia can be long-term (chronic) or short-term (acute). Both are common. Short-term insomnia usually lasts for 3 months or less. Chronic insomnia occurs at least three times a week for longer than 3 months. What are the causes? Insomnia may be caused by another condition, situation, or substance, such as: Having certain mental health conditions, such as anxiety and depression. Using  caffeine, alcohol, tobacco, or drugs. Having gastrointestinal conditions, such as gastroesophageal reflux disease (GERD). Having certain medical conditions. These include: Asthma. Alzheimer's disease. Stroke. Chronic pain. An overactive thyroid gland (hyperthyroidism). Other sleep disorders, such as restless legs syndrome and sleep apnea. Menopause. Sometimes, the cause of insomnia may not be known. What increases the risk? Risk factors for insomnia include: Gender. Females are affected more often than males. Age. Insomnia is more common as people get older. Stress and certain medical and mental health conditions. Lack of exercise. Having an irregular work schedule. This may include working night shifts and traveling between different time zones. What are the signs or symptoms? If you have insomnia, the main symptom is having trouble falling asleep or having trouble staying asleep. This may lead to other symptoms, such as: Feeling tired or having low energy. Feeling nervous about going to sleep. Not feeling rested in the morning. Having trouble concentrating. Feeling irritable, anxious, or depressed. How is this diagnosed? This condition may be diagnosed based on: Your symptoms and medical history. Your health care provider may ask about: Your sleep habits. Any medical conditions you have. Your mental health. A physical exam. How is this treated? Treatment for insomnia depends on the cause. Treatment may focus on treating an underlying condition that is causing the insomnia. Treatment may also include: Medicines to help you sleep. Counseling or therapy. Lifestyle adjustments to help you sleep better. Follow these instructions at home: Eating and drinking  Limit or avoid alcohol, caffeinated beverages, and products that contain nicotine and tobacco, especially close to bedtime. These can disrupt your sleep. Do not eat a large  meal or eat spicy foods right before bedtime. This  can lead to digestive discomfort that can make it hard for you to sleep. Sleep habits  Keep a sleep diary to help you and your health care provider figure out what could be causing your insomnia. Write down: When you sleep. When you wake up during the night. How well you sleep and how rested you feel the next day. Any side effects of medicines you are taking. What you eat and drink. Make your bedroom a dark, comfortable place where it is easy to fall asleep. Put up shades or blackout curtains to block light from outside. Use a white noise machine to block noise. Keep the temperature cool. Limit screen use before bedtime. This includes: Not watching TV. Not using your smartphone, tablet, or computer. Stick to a routine that includes going to bed and waking up at the same times every day and night. This can help you fall asleep faster. Consider making a quiet activity, such as reading, part of your nighttime routine. Try to avoid taking naps during the day so that you sleep better at night. Get out of bed if you are still awake after 15 minutes of trying to sleep. Keep the lights down, but try reading or doing a quiet activity. When you feel sleepy, go back to bed. General instructions Take over-the-counter and prescription medicines only as told by your health care provider. Exercise regularly as told by your health care provider. However, avoid exercising in the hours right before bedtime. Use relaxation techniques to manage stress. Ask your health care provider to suggest some techniques that may work well for you. These may include: Breathing exercises. Routines to release muscle tension. Visualizing peaceful scenes. Make sure that you drive carefully. Do not drive if you feel very sleepy. Keep all follow-up visits. This is important. Contact a health care provider if: You are tired throughout the day. You have trouble in your daily routine due to sleepiness. You continue to have  sleep problems, or your sleep problems get worse. Get help right away if: You have thoughts about hurting yourself or someone else. Get help right away if you feel like you may hurt yourself or others, or have thoughts about taking your own life. Go to your nearest emergency room or: Call 911. Call the National Suicide Prevention Lifeline at 660-758-8376 or 988. This is open 24 hours a day. Text the Crisis Text Line at 7874336497. Summary Insomnia is a sleep disorder that makes it difficult to fall asleep or stay asleep. Insomnia can be long-term (chronic) or short-term (acute). Treatment for insomnia depends on the cause. Treatment may focus on treating an underlying condition that is causing the insomnia. Keep a sleep diary to help you and your health care provider figure out what could be causing your insomnia. This information is not intended to replace advice given to you by your health care provider. Make sure you discuss any questions you have with your health care provider. Document Revised: 09/10/2021 Document Reviewed: 09/10/2021 Elsevier Patient Education  2024 [employer].

## 2023-09-29 ENCOUNTER — Encounter: Payer: Self-pay | Admitting: Nurse Practitioner

## 2023-09-29 ENCOUNTER — Telehealth: Payer: Self-pay | Admitting: Nurse Practitioner

## 2023-09-29 ENCOUNTER — Ambulatory Visit (INDEPENDENT_AMBULATORY_CARE_PROVIDER_SITE_OTHER): Payer: Medicare HMO | Admitting: Nurse Practitioner

## 2023-09-29 VITALS — BP 114/68 | HR 73 | Temp 97.8°F | Resp 14 | Wt 162.6 lb

## 2023-09-29 DIAGNOSIS — F5104 Psychophysiologic insomnia: Secondary | ICD-10-CM | POA: Diagnosis not present

## 2023-09-29 MED ORDER — ZOLPIDEM TARTRATE 5 MG PO TABS: 5.0000 mg | ORAL_TABLET | Freq: Every evening | ORAL | 0 refills | Status: DC | PRN

## 2023-09-29 NOTE — Assessment & Plan Note (Signed)
After loss of husband in March 2024.  Melatonin offered no benefit and Trazodone did not work at low dose, but at next dose up made her too groggy.  Advised her not to use Tylenol PM due to Benadryl and BEERS criteria. Insurance is not Statistician, but is offering Ambien and Temazepam as alternatives.  Discussed with her, she is aware of BEERS criteria on these, however insurance will not cover Belsomra until she has tried alternatives they listed.  We will try a low dose of Ambien 5 MG.  Educated her on this medication. If does not offer benefit then will attempt to get Belsomra.  Return in 4 weeks and continue to work on good sleep hygiene.

## 2023-09-29 NOTE — Progress Notes (Signed)
BP 114/68 (BP Location: Left Arm, Patient Position: Sitting, Cuff Size: Normal)   Pulse 73   Temp 97.8 F (36.6 C) (Oral)   Resp 14   Wt 162 lb 9.6 oz (73.8 kg)   SpO2 98%   BMI 29.74 kg/m    Subjective:    Patient ID: Charlotte Reyes, female    DOB: 1951-01-29, 72 y.o.   MRN: [id number]  HPI: Charlotte Reyes is a 72 y.o. female  Chief Complaint  Patient presents with   Insomnia    Insurance didn't cover Belsomra. PA has been denied. Preferred alternatives are Dayvigo, Doxepin, Zolpidem, or Temazepam   INSOMNIA Has struggled with insomnia for years. Insurance would not cover Belsomra, unless she had trial of other medications.  Tried Trazodone in past which offered no benefit. Duration: chronic Satisfied with sleep quality: no Difficulty falling asleep: yes Difficulty staying asleep: yes Waking a few hours after sleep onset: yes Early morning awakenings: no Daytime hypersomnolence: no Wakes feeling refreshed: no Good sleep hygiene: yes Apnea: no Snoring: no Depressed/anxious mood: no Recent stress: no Restless legs/nocturnal leg cramps: no Chronic pain/arthritis: no History of sleep study: no Treatments attempted: Trazodone    Relevant past medical, surgical, family and social history reviewed and updated as indicated. Interim medical history since our last visit reviewed. Allergies and medications reviewed and updated.  Review of Systems  Constitutional:  Negative for activity change, appetite change, diaphoresis, fatigue and fever.  Respiratory:  Negative for cough, chest tightness, shortness of breath and wheezing.   Cardiovascular:  Negative for chest pain, palpitations and leg swelling.  Gastrointestinal: Negative.   Neurological: Negative.   Psychiatric/Behavioral:  Positive for sleep disturbance. Negative for decreased concentration, self-injury and suicidal ideas. The patient is not nervous/anxious.    Per HPI unless specifically indicated  above     Objective:    BP 114/68 (BP Location: Left Arm, Patient Position: Sitting, Cuff Size: Normal)   Pulse 73   Temp 97.8 F (36.6 C) (Oral)   Resp 14   Wt 162 lb 9.6 oz (73.8 kg)   SpO2 98%   BMI 29.74 kg/m   Wt Readings from Last 3 Encounters:  09/29/23 162 lb 9.6 oz (73.8 kg)  09/02/23 161 lb (73 kg)  08/26/23 162 lb 6.4 oz (73.7 kg)    Physical Exam Vitals and nursing note reviewed.  Constitutional:      General: She is awake. She is not in acute distress.    Appearance: She is well-developed and well-groomed. She is not ill-appearing or toxic-appearing.  HENT:     Head: Normocephalic.     Right Ear: Hearing and external ear normal.     Left Ear: Hearing and external ear normal.  Eyes:     General: Lids are normal.        Right eye: No discharge.        Left eye: No discharge.     Conjunctiva/sclera: Conjunctivae normal.     Pupils: Pupils are equal, round, and reactive to light.  Neck:     Thyroid: No thyromegaly.     Vascular: No carotid bruit.  Cardiovascular:     Rate and Rhythm: Normal rate and regular rhythm.     Heart sounds: Normal heart sounds. No murmur heard.    No gallop.  Pulmonary:     Effort: Pulmonary effort is normal. No accessory muscle usage or respiratory distress.     Breath sounds: Normal breath sounds.  Abdominal:  General: Bowel sounds are normal. There is no distension.     Palpations: Abdomen is soft.     Tenderness: There is no abdominal tenderness.  Musculoskeletal:     Cervical back: Normal range of motion and neck supple.     Right lower leg: No edema.     Left lower leg: No edema.  Lymphadenopathy:     Cervical: No cervical adenopathy.  Skin:    General: Skin is warm and dry.  Neurological:     Mental Status: She is alert and oriented to person, place, and time.     Deep Tendon Reflexes: Reflexes are normal and symmetric.     Reflex Scores:      Brachioradialis reflexes are 2+ on the right side and 2+ on the left  side.      Patellar reflexes are 2+ on the right side and 2+ on the left side. Psychiatric:        Attention and Perception: Attention normal.        Mood and Affect: Mood normal.        Speech: Speech normal.        Behavior: Behavior normal. Behavior is cooperative.        Thought Content: Thought content normal.    Results for orders placed or performed in visit on 08/26/23  Bayer DCA Hb A1c Waived   Collection Time: 08/26/23  1:54 PM  Result Value Ref Range   HB A1C (BAYER DCA - WAIVED) 5.4 4.8 - 5.6 %  CBC with Differential/Platelet   Collection Time: 08/26/23  1:57 PM  Result Value Ref Range   WBC 5.1 3.4 - 10.8 x10E3/uL   RBC 4.02 3.77 - 5.28 x10E6/uL   Hemoglobin 12.4 11.1 - 15.9 g/dL   Hematocrit 52.8 41.3 - 46.6 %   MCV 95 79 - 97 fL   MCH 30.8 26.6 - 33.0 pg   MCHC 32.6 31.5 - 35.7 g/dL   RDW 24.4 01.0 - 27.2 %   Platelets 270 150 - 450 x10E3/uL   Neutrophils 58 Not Estab. %   Lymphs 29 Not Estab. %   Monocytes 9 Not Estab. %   Eos 3 Not Estab. %   Basos 1 Not Estab. %   Neutrophils Absolute 2.9 1.4 - 7.0 x10E3/uL   Lymphocytes Absolute 1.5 0.7 - 3.1 x10E3/uL   Monocytes Absolute 0.5 0.1 - 0.9 x10E3/uL   EOS (ABSOLUTE) 0.2 0.0 - 0.4 x10E3/uL   Basophils Absolute 0.1 0.0 - 0.2 x10E3/uL   Immature Granulocytes 0 Not Estab. %   Immature Grans (Abs) 0.0 0.0 - 0.1 x10E3/uL  Comprehensive metabolic panel   Collection Time: 08/26/23  1:57 PM  Result Value Ref Range   Glucose 91 70 - 99 mg/dL   BUN 13 8 - 27 mg/dL   Creatinine, Ser 5.36 0.57 - 1.00 mg/dL   eGFR 72 >64 QI/HKV/4.25   BUN/Creatinine Ratio 15 12 - 28   Sodium 142 134 - 144 mmol/L   Potassium 4.5 3.5 - 5.2 mmol/L   Chloride 103 96 - 106 mmol/L   CO2 25 20 - 29 mmol/L   Calcium 9.3 8.7 - 10.3 mg/dL   Total Protein 6.8 6.0 - 8.5 g/dL   Albumin 4.3 3.8 - 4.8 g/dL   Globulin, Total 2.5 1.5 - 4.5 g/dL   Bilirubin Total <9.5 0.0 - 1.2 mg/dL   Alkaline Phosphatase 86 44 - 121 IU/L   AST 15 0 - 40 IU/L    ALT  17 0 - 32 IU/L  TSH   Collection Time: 08/26/23  1:57 PM  Result Value Ref Range   TSH 1.010 0.450 - 4.500 uIU/mL  Lipid Panel w/o Chol/HDL Ratio   Collection Time: 08/26/23  1:57 PM  Result Value Ref Range   Cholesterol, Total 234 (H) 100 - 199 mg/dL   Triglycerides 161 (H) 0 - 149 mg/dL   HDL 67 >09 mg/dL   VLDL Cholesterol Cal 27 5 - 40 mg/dL   LDL Chol Calc (NIH) 604 (H) 0 - 99 mg/dL  VITAMIN D 25 Hydroxy (Vit-D Deficiency, Fractures)   Collection Time: 08/26/23  1:57 PM  Result Value Ref Range   Vit D, 25-Hydroxy 27.3 (L) 30.0 - 100.0 ng/mL  Magnesium   Collection Time: 08/26/23  1:57 PM  Result Value Ref Range   Magnesium 2.2 1.6 - 2.3 mg/dL  Hepatitis C antibody   Collection Time: 08/26/23  1:57 PM  Result Value Ref Range   Hep C Virus Ab Non Reactive Non Reactive      Assessment & Plan:   Problem List Items Addressed This Visit       Other   Insomnia - Primary   After loss of husband in March 2024.  Melatonin offered no benefit and Trazodone did not work at low dose, but at next dose up made her too groggy.  Advised her not to use Tylenol PM due to Benadryl and BEERS criteria. Insurance is not Statistician, but is offering Ambien and Temazepam as alternatives.  Discussed with her, she is aware of BEERS criteria on these, however insurance will not cover Belsomra until she has tried alternatives they listed.  We will try a low dose of Ambien 5 MG.  Educated her on this medication. If does not offer benefit then will attempt to get Belsomra.  Return in 4 weeks and continue to work on good sleep hygiene.        Follow up plan: Return in about 4 weeks (around 10/27/2023) for Insomnia and HLD.

## 2023-09-29 NOTE — Telephone Encounter (Signed)
Mark with [employer] services is calling in because pt received a bill for the date 08/26/23 and the bill has annual wellness as the appointment. [employer] says pt had an annual wellness done in February of this year and wants to know why pt needed another one. Loraine Leriche says the code on the bill is [phone] and he would like to speak with someone in office regarding this so they can straighten it out for the pt.

## 2023-10-10 ENCOUNTER — Other Ambulatory Visit: Payer: Medicare HMO

## 2023-10-31 ENCOUNTER — Ambulatory Visit: Payer: Self-pay

## 2023-10-31 ENCOUNTER — Ambulatory Visit
Admission: EM | Admit: 2023-10-31 | Discharge: 2023-10-31 | Disposition: A | Discharge status: Home / Self Care | Payer: HMO | Attending: Emergency Medicine | Admitting: Emergency Medicine

## 2023-10-31 DIAGNOSIS — R350 Frequency of micturition: Secondary | ICD-10-CM | POA: Insufficient documentation

## 2023-10-31 DIAGNOSIS — N898 Other specified noninflammatory disorders of vagina: Secondary | ICD-10-CM | POA: Insufficient documentation

## 2023-10-31 LAB — POCT URINALYSIS DIP (MANUAL ENTRY)
Bilirubin, UA: NEGATIVE
Glucose, UA: NEGATIVE mg/dL
Ketones, POC UA: NEGATIVE mg/dL
Leukocytes, UA: NEGATIVE
Nitrite, UA: NEGATIVE
Protein Ur, POC: NEGATIVE mg/dL
Spec Grav, UA: 1.015
Urobilinogen, UA: 0.2 U/dL
pH, UA: 7

## 2023-10-31 MED ORDER — PHENAZOPYRIDINE HCL 200 MG PO TABS: 200.0000 mg | ORAL_TABLET | Freq: Three times a day (TID) | ORAL | 0 refills | Status: DC

## 2023-10-31 MED ORDER — METRONIDAZOLE 500 MG PO TABS: 500.0000 mg | ORAL_TABLET | Freq: Two times a day (BID) | ORAL | 0 refills | Status: DC

## 2023-10-31 NOTE — ED Provider Notes (Signed)
Renaldo Fiddler    CSN: [id number] Arrival date & time: 10/31/23  1156      History   Chief Complaint Chief Complaint  Patient presents with   Urinary Frequency    HPI Charlotte Reyes is a 73 y.o. female.   Patient presents for evaluation of urinary frequency, dysuria, vaginal odor and a mild increase in discharge present for 4 days.  Has not attempted treatment.  Denies hematuria, abdominal or flank pain, fevers or vaginal itching or irritation.  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   Avitaminosis D 07/18/2023   Family history of breast cancer in female 07/18/2023   OP (osteoporosis) 07/18/2023   History of mastectomy, left 07/18/2023   Esophageal reflux 05/05/2014    Past Surgical History:  Procedure Laterality Date   COLONOSCOPY WITH PROPOFOL N/A 09/02/2017   Procedure: COLONOSCOPY WITH PROPOFOL;  Surgeon: Christena Deem, MD;  Location: [facility] ENDOSCOPY;  Service: Endoscopy;  Laterality: N/A;   COLONOSCOPY WITH PROPOFOL N/A 09/02/2023   Procedure: COLONOSCOPY WITH PROPOFOL;  Surgeon: Regis Bill, MD;  Location: [facility] ENDOSCOPY;  Service: Endoscopy;  Laterality: N/A;   INCISE AND DRAIN ABCESS     sebaceous cyst on back   MASTECTOMY  1998   left   TOTAL ABDOMINAL HYSTERECTOMY  1994    OB History   No obstetric history on file.      Home Medications    Prior to Admission medications    Medication Sig Start Date End Date Taking? Authorizing Provider  metroNIDAZOLE (FLAGYL) 500 MG tablet Take 1 tablet (500 mg total) by mouth 2 (two) times daily. 10/31/23  Yes Serrina Minogue, Elita Boone, NP  phenazopyridine (PYRIDIUM) 200 MG tablet Take 1 tablet (200 mg total) by mouth 3 (three) times daily. 10/31/23  Yes Lazlo Tunney R, NP  fexofenadine (ALLEGRA) 180 MG tablet Take 180 mg by mouth daily as needed for allergies.    [provider]  levothyroxine (SYNTHROID) 100 MCG tablet Take 100 mcg by mouth daily before breakfast.    [provider]  MAGNESIUM PO Take 1 tablet by mouth daily at 2 PM. Patient unsure of dose    [provider]  pantoprazole (PROTONIX) 40 MG tablet Take 1 tablet by mouth daily. 11/11/22   [provider]  POTASSIUM PO Take 1 tablet by mouth daily. Patient unsure of dose    [provider]  TURMERIC PO Take 2 tablets by mouth daily at 2 PM. Patient unsure of dose    [provider]  zolpidem (AMBIEN) 5 MG tablet Take 1 tablet (5 mg total) by mouth at bedtime as needed for sleep. 09/29/23   Marjie Skiff, NP    Family History Family History  Problem Relation Age of Onset   Cancer Mother        ovarian   Cancer Father  colon   Hemophilia Brother    Clotting disorder Brother    Breast cancer Paternal Aunt 56    Social History Social History   Tobacco Use   Smoking status: Never   Smokeless tobacco: Never  Vaping Use   Vaping status: Never Used  Substance Use Topics   Alcohol use: Yes    Comment: socially   Drug use: No     Allergies   Amoxicillin-pot clavulanate   Review of Systems Review of Systems   Physical Exam Triage Vital Signs ED Triage Vitals [10/31/23 1326]  Encounter Vitals Group     BP (!) 160/84     Systolic BP Percentile      Diastolic BP Percentile      Pulse Rate 85     Resp 16     Temp 97.8 F (36.6 C)     Temp Source Temporal     SpO2 97 %     Weight       Height      Head Circumference      Peak Flow      Pain Score 0     Pain Loc      Pain Education      Exclude from Growth Chart    No data found.  Updated Vital Signs BP (!) 160/84 (BP Location: Left Arm)   Pulse 85   Temp 97.8 F (36.6 C) (Temporal)   Resp 16   SpO2 97%   Visual Acuity Right Eye Distance:   Left Eye Distance:   Bilateral Distance:    Right Eye Near:   Left Eye Near:    Bilateral Near:     Physical Exam Constitutional:      Appearance: Normal appearance.  Eyes:     Extraocular Movements: Extraocular movements intact.  Pulmonary:     Effort: Pulmonary effort is normal.  Abdominal:     Tenderness: There is no abdominal tenderness. There is no right CVA tenderness, left CVA tenderness or guarding.  Neurological:     Mental Status: She is alert and oriented to person, place, and time. Mental status is at baseline.      UC Treatments / Results  Labs (all labs ordered are listed, but only abnormal results are displayed) Labs Reviewed  POCT URINALYSIS DIP (MANUAL ENTRY) - Abnormal; Notable for the following components:      Result Value   Blood, UA trace-intact (*)    All other components within normal limits  CERVICOVAGINAL ANCILLARY ONLY    EKG   Radiology No results found.  Procedures Procedures (including critical care time)  Medications Ordered in UC Medications - No data to display  Initial Impression / Assessment and Plan / UC Course  I have reviewed the triage vital signs and the nursing notes.  Pertinent labs & imaging results that were available during my care of the patient were reviewed by me and considered in my medical decision making (see chart for details).  Urinary frequency, vaginal odor  Urinalysis negative, sent for culture, will initiate antibiotic based on results, discussed this with patient, vaginal swab checking for yeast and BV pending, treating prophylactically for bacterial vaginosis that she has vaginal  odor and discharge, metronidazole prescribed as well as Pyridium for supportive care recommended additional nonpharmacological treatments and advised follow-up as needed Final Clinical Impressions(s) / UC Diagnoses   Final diagnoses:  Urinary frequency  Vaginal odor     Discharge Instructions      Today you  are being treated prophylactically for  Bacterial vaginosis   Urinalysis does not show a bladder infection, has been sent to the lab to see if bacteria will grow, you will be notified if this occurs and antibiotic sent into pharmacy  Vaginal swab checking for yeast and bacterial vaginosis is pending 2 to 3 days, you will be notified of positive test results only  Take Metronidazole 500 mg twice a day for 7 days, do not drink alcohol while using medication, this will make you feel sick   Use Pyridium every 8 hours as needed to help minimize frequency and burning with urination, will turn your urine bright orange  Bacterial vaginosis which results from an overgrowth of one on several organisms that are normally present in your vagina. Vaginosis is an inflammation of the vagina that can result in discharge, itching and pain   In addition: Avoid baths, hot tubs and whirlpool spas.  Don't use scented or harsh soaps Avoid irritants. These include scented tampons and pads. Wipe from front to back after using the toilet. Don't douche. Your vagina doesn't require cleansing other than normal bathing.  Use a condom.  Wear cotton underwear, this fabric absorbs some moisture.        ED Prescriptions     Medication Sig Dispense Auth. Provider   metroNIDAZOLE (FLAGYL) 500 MG tablet Take 1 tablet (500 mg total) by mouth 2 (two) times daily. 14 tablet Kenza Munar R, NP   phenazopyridine (PYRIDIUM) 200 MG tablet Take 1 tablet (200 mg total) by mouth 3 (three) times daily. 6 tablet Valinda Hoar, NP      PDMP not reviewed this encounter.   Valinda Hoar, NP 10/31/23  1421

## 2023-10-31 NOTE — ED Triage Notes (Signed)
Patient presents to UC for urinary freq, dysuria x 1 week. Has not taken any OTC meds.

## 2023-10-31 NOTE — Telephone Encounter (Signed)
Message from Phill Myron sent at 10/31/2023  9:02 AM EST  Summary: Question UTI   1 week frequent urination, burning, odor,         Chief Complaint: urinary burning Symptoms: vaginal discharge with foul odor,urinary frequency Frequency: Monday Pertinent Negatives: Patient denies back or flank pain, fever, blood in urine Disposition: [] ED /[x] Urgent Care (no appt availability in office) / [] Appointment(In office/virtual)/ []  [place] [facility]/ [] Home Care/ [] Refused Recommended Disposition /[] [place] [facility]/ []  Follow-up with PCP Additional Notes: no appts UC  Reason for Disposition  Age > 50 years  Answer Assessment - Initial Assessment Questions 1. SEVERITY: "How bad is the pain?"  (e.g., Scale 1-10; mild, moderate, or severe)   - MILD (1-3): complains slightly about urination hurting   - MODERATE (4-7): interferes with normal activities     - SEVERE (8-10): excruciating, unwilling or unable to urinate because of the pain      mild 3. PATTERN: "Is pain present every time you urinate or just sometimes?"      No not when drinking a lot  4. ONSET: "When did the painful urination start?"      Monday  5. FEVER: "Do you have a fever?" If Yes, ask: "What is your temperature, how was it measured, and when did it start?"     no  7. CAUSE: "What do you think is causing the painful urination?"  (e.g., UTI, scratch, Herpes sore)     UTI 8. OTHER SYMPTOMS: "Do you have any other symptoms?" (e.g., blood in urine, flank pain, genital sores, urgency, vaginal discharge)     Frequently, vaginal discharge with foul odor  Protocols used: Urination Pain - Female-A-AH

## 2023-10-31 NOTE — Discharge Instructions (Addendum)
Today you are being treated prophylactically for  Bacterial vaginosis   Urinalysis does not show a bladder infection, has been sent to the lab to see if bacteria will grow, you will be notified if this occurs and antibiotic sent into pharmacy  Vaginal swab checking for yeast and bacterial vaginosis is pending 2 to 3 days, you will be notified of positive test results only  Take Metronidazole 500 mg twice a day for 7 days, do not drink alcohol while using medication, this will make you feel sick   Use Pyridium every 8 hours as needed to help minimize frequency and burning with urination, will turn your urine bright orange  Bacterial vaginosis which results from an overgrowth of one on several organisms that are normally present in your vagina. Vaginosis is an inflammation of the vagina that can result in discharge, itching and pain   In addition: Avoid baths, hot tubs and whirlpool spas.  Don't use scented or harsh soaps Avoid irritants. These include scented tampons and pads. Wipe from front to back after using the toilet. Don't douche. Your vagina doesn't require cleansing other than normal bathing.  Use a condom.  Wear cotton underwear, this fabric absorbs some moisture.

## 2023-11-02 LAB — URINE CULTURE: Culture: >=100000 — AB

## 2023-11-03 ENCOUNTER — Other Ambulatory Visit: Payer: Self-pay | Admitting: Nurse Practitioner

## 2023-11-03 ENCOUNTER — Telehealth: Payer: Self-pay | Admitting: Nurse Practitioner

## 2023-11-03 LAB — CERVICOVAGINAL ANCILLARY ONLY
Bacterial Vaginitis (gardnerella): NEGATIVE
Candida Glabrata: NEGATIVE
Candida Vaginitis: NEGATIVE
Comment: NEGATIVE
Comment: NEGATIVE
Comment: NEGATIVE

## 2023-11-03 NOTE — Addendum Note (Signed)
Addended by: Wilford Corner on: 11/03/2023 12:57 PM   Modules accepted: Orders

## 2023-11-03 NOTE — Telephone Encounter (Signed)
DUPLICATE REQUEST  Requested medication (s) are due for refill today: yes  Requested medication (s) are on the active medication list: yes  Last refill:  09/29/23 #30/0  Future visit scheduled: yes  Notes to clinic:  Unable to refill per protocol, cannot delegate.    Requested Prescriptions  Pending Prescriptions Disp Refills   zolpidem (AMBIEN) 5 MG tablet 30 tablet 0    Sig: Take 1 tablet (5 mg total) by mouth at bedtime as needed for sleep.     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

## 2023-11-03 NOTE — Telephone Encounter (Addendum)
Medication Refill -  Most Recent Primary Care Visit:  Provider: Aura Dials T  Department: [facility]  Visit Type: OFFICE VISIT  Date: 09/29/2023  Medication: zolpidem (AMBIEN) 5 MG tablet   Has the patient contacted their pharmacy? Yes They advised pt it may be friday, but p is completely out of this med.  Hoping it can be sent in asap.  This Rx is working, and she has tried several things Is this the correct pharmacy for this prescription? Yes If no, delete pharmacy and type the correct one.  This is the patient's preferred pharmacy:  [id number] - [address] [place] [address] Phone: [phone] Fax: [phone]   Has the prescription been filled recently? Yes  Is the patient out of the medication? Yes  Has the patient been seen for an appointment in the last year OR does the patient have an upcoming appointment? Yes  Can we respond through MyChart? Yes  Agent: Please be advised that Rx refills may take up to 3 business days. We ask that you follow-up with your pharmacy.

## 2023-11-03 NOTE — Telephone Encounter (Signed)
Requested medication (s) are due for refill today: yes  Requested medication (s) are on the active medication list: yes  Last refill:  09/29/23 #30/0  Future visit scheduled: yes  Notes to clinic:  Unable to refill per protocol, cannot delegate.      Requested Prescriptions  Pending Prescriptions Disp Refills   zolpidem (AMBIEN) 5 MG tablet [Pharmacy Med Name: ZOLPIDEM TARTRATE 5 MG TABLET] 30 tablet 0    Sig: TAKE 1 TABLET BY MOUTH AT BEDTIME AS NEEDED FOR SLEEP.     Not Delegated - Psychiatry:  Anxiolytics/Hypnotics Failed - 11/03/2023  4:45 PM      Failed - This refill cannot be delegated      Failed - Urine Drug Screen completed in last 360 days      Passed - Valid encounter within last 6 months    Recent Outpatient Visits           1 month ago Psychophysiological insomnia   [place] [facility] [place], Corrie Dandy T, NP   2 months ago Medicare annual wellness visit, subsequent   [place] [facility] [place], [place] T, NP   3 months ago Autoimmune lymphocytic chronic thyroiditis   [place] [facility] [place], Dorie Rank, NP       Future Appointments             In 1 week Cannady, Dorie Rank, NP [facility], [facility]   In 4 weeks [place], Dorie Rank, NP [place] [facility], [facility]

## 2023-11-04 ENCOUNTER — Telehealth (HOSPITAL_COMMUNITY): Payer: Self-pay

## 2023-11-04 MED ORDER — CIPROFLOXACIN HCL 250 MG PO TABS: 250.0000 mg | ORAL_TABLET | Freq: Two times a day (BID) | ORAL | 0 refills | Status: AC

## 2023-11-04 MED ORDER — ZOLPIDEM TARTRATE 5 MG PO TABS: 5.0000 mg | ORAL_TABLET | Freq: Every evening | ORAL | 0 refills | Status: DC | PRN

## 2023-11-04 NOTE — Telephone Encounter (Signed)
Per P. Banister, MD, "For her UTI due to Klebsiella, I would tx with cipro 250 mg, #14, 1 po BID x 7 days, please ." Reviewed with patient, verified pharmacy, prescription sent.

## 2023-11-06 ENCOUNTER — Ambulatory Visit
Admission: RE | Admit: 2023-11-06 | Discharge: 2023-11-06 | Disposition: A | Discharge status: Home / Self Care | Payer: HMO | Source: Ambulatory Visit | Attending: Nurse Practitioner | Admitting: Nurse Practitioner

## 2023-11-06 ENCOUNTER — Encounter: Payer: Self-pay | Admitting: Nurse Practitioner

## 2023-11-06 DIAGNOSIS — M8588 Other specified disorders of bone density and structure, other site: Secondary | ICD-10-CM | POA: Diagnosis not present

## 2023-11-06 DIAGNOSIS — R92333 Mammographic heterogeneous density, bilateral breasts: Secondary | ICD-10-CM | POA: Insufficient documentation

## 2023-11-06 DIAGNOSIS — Z780 Asymptomatic menopausal state: Secondary | ICD-10-CM | POA: Insufficient documentation

## 2023-11-06 DIAGNOSIS — Z9012 Acquired absence of left breast and nipple: Secondary | ICD-10-CM | POA: Diagnosis not present

## 2023-11-06 DIAGNOSIS — Z853 Personal history of malignant neoplasm of breast: Secondary | ICD-10-CM | POA: Diagnosis not present

## 2023-11-06 DIAGNOSIS — M810 Age-related osteoporosis without current pathological fracture: Secondary | ICD-10-CM

## 2023-11-06 DIAGNOSIS — Z1231 Encounter for screening mammogram for malignant neoplasm of breast: Secondary | ICD-10-CM | POA: Diagnosis not present

## 2023-11-06 NOTE — Progress Notes (Signed)
Contacted via [place]   Your bone density shows thinning bones (osteopenia) but not brittle (osteoporosis). We recommend Vitamin D supplementation of about 2,0000 IUs of over the counter Vitamin D3. In addition, we recommend a diet high in calcium with dairy and dark green leafy vegetables. We would like you to get plenty of weight bearing exercises with walking and resistance training such as light weights or resistance bands available with instructions at places such as [employer].

## 2023-11-07 ENCOUNTER — Encounter: Payer: Self-pay | Admitting: Nurse Practitioner

## 2023-11-07 NOTE — Progress Notes (Signed)
Contacted via MyChart   Normal mammogram, may repeat in one year:)

## 2023-11-09 NOTE — Patient Instructions (Signed)
Insomnia Insomnia is a sleep disorder that makes it difficult to fall asleep or stay asleep. Insomnia can cause fatigue, low energy, difficulty concentrating, mood swings, and poor performance at work or school. There are three different ways to classify insomnia: Difficulty falling asleep. Difficulty staying asleep. Waking up too early in the morning. Any type of insomnia can be long-term (chronic) or short-term (acute). Both are common. Short-term insomnia usually lasts for 3 months or less. Chronic insomnia occurs at least three times a week for longer than 3 months. What are the causes? Insomnia may be caused by another condition, situation, or substance, such as: Having certain mental health conditions, such as anxiety and depression. Using caffeine, alcohol, tobacco, or drugs. Having gastrointestinal conditions, such as gastroesophageal reflux disease (GERD). Having certain medical conditions. These include: Asthma. Alzheimer's disease. Stroke. Chronic pain. An overactive thyroid gland (hyperthyroidism). Other sleep disorders, such as restless legs syndrome and sleep apnea. Menopause. Sometimes, the cause of insomnia may not be known. What increases the risk? Risk factors for insomnia include: Gender. Females are affected more often than males. Age. Insomnia is more common as people get older. Stress and certain medical and mental health conditions. Lack of exercise. Having an irregular work schedule. This may include working night shifts and traveling between different time zones. What are the signs or symptoms? If you have insomnia, the main symptom is having trouble falling asleep or having trouble staying asleep. This may lead to other symptoms, such as: Feeling tired or having low energy. Feeling nervous about going to sleep. Not feeling rested in the morning. Having trouble concentrating. Feeling irritable, anxious, or depressed. How is this diagnosed? This condition  may be diagnosed based on: Your symptoms and medical history. Your health care provider may ask about: Your sleep habits. Any medical conditions you have. Your mental health. A physical exam. How is this treated? Treatment for insomnia depends on the cause. Treatment may focus on treating an underlying condition that is causing the insomnia. Treatment may also include: Medicines to help you sleep. Counseling or therapy. Lifestyle adjustments to help you sleep better. Follow these instructions at home: Eating and drinking  Limit or avoid alcohol, caffeinated beverages, and products that contain nicotine and tobacco, especially close to bedtime. These can disrupt your sleep. Do not eat a large meal or eat spicy foods right before bedtime. This can lead to digestive discomfort that can make it hard for you to sleep. Sleep habits  Keep a sleep diary to help you and your health care provider figure out what could be causing your insomnia. Write down: When you sleep. When you wake up during the night. How well you sleep and how rested you feel the next day. Any side effects of medicines you are taking. What you eat and drink. Make your bedroom a dark, comfortable place where it is easy to fall asleep. Put up shades or blackout curtains to block light from outside. Use a white noise machine to block noise. Keep the temperature cool. Limit screen use before bedtime. This includes: Not watching TV. Not using your smartphone, tablet, or computer. Stick to a routine that includes going to bed and waking up at the same times every day and night. This can help you fall asleep faster. Consider making a quiet activity, such as reading, part of your nighttime routine. Try to avoid taking naps during the day so that you sleep better at night. Get out of bed if you are still awake after  15 minutes of trying to sleep. Keep the lights down, but try reading or doing a quiet activity. When you feel  sleepy, go back to bed. General instructions Take over-the-counter and prescription medicines only as told by your health care provider. Exercise regularly as told by your health care provider. However, avoid exercising in the hours right before bedtime. Use relaxation techniques to manage stress. Ask your health care provider to suggest some techniques that may work well for you. These may include: Breathing exercises. Routines to release muscle tension. Visualizing peaceful scenes. Make sure that you drive carefully. Do not drive if you feel very sleepy. Keep all follow-up visits. This is important. Contact a health care provider if: You are tired throughout the day. You have trouble in your daily routine due to sleepiness. You continue to have sleep problems, or your sleep problems get worse. Get help right away if: You have thoughts about hurting yourself or someone else. Get help right away if you feel like you may hurt yourself or others, or have thoughts about taking your own life. Go to your nearest emergency room or: Call 911. Call the National Suicide Prevention Lifeline at (437)486-8660 or 988. This is open 24 hours a day. Text the Crisis Text Line at (586)746-2410. Summary Insomnia is a sleep disorder that makes it difficult to fall asleep or stay asleep. Insomnia can be long-term (chronic) or short-term (acute). Treatment for insomnia depends on the cause. Treatment may focus on treating an underlying condition that is causing the insomnia. Keep a sleep diary to help you and your health care provider figure out what could be causing your insomnia. This information is not intended to replace advice given to you by your health care provider. Make sure you discuss any questions you have with your health care provider. Document Revised: 09/10/2021 Document Reviewed: 09/10/2021 Elsevier Patient Education  2024 [employer].

## 2023-11-10 ENCOUNTER — Ambulatory Visit
Admission: RE | Admit: 2023-11-10 | Discharge: 2023-11-10 | Disposition: A | Discharge status: Home / Self Care | Payer: HMO | Source: Ambulatory Visit | Attending: Gastroenterology | Admitting: Gastroenterology

## 2023-11-10 DIAGNOSIS — K6389 Other specified diseases of intestine: Secondary | ICD-10-CM | POA: Diagnosis not present

## 2023-11-10 DIAGNOSIS — K56699 Other intestinal obstruction unspecified as to partial versus complete obstruction: Secondary | ICD-10-CM

## 2023-11-10 DIAGNOSIS — K5730 Diverticulosis of large intestine without perforation or abscess without bleeding: Secondary | ICD-10-CM | POA: Diagnosis not present

## 2023-11-13 ENCOUNTER — Ambulatory Visit (INDEPENDENT_AMBULATORY_CARE_PROVIDER_SITE_OTHER): Payer: Self-pay | Admitting: Nurse Practitioner

## 2023-11-13 ENCOUNTER — Encounter: Payer: Self-pay | Admitting: Nurse Practitioner

## 2023-11-13 VITALS — BP 131/71 | HR 84 | Temp 98.0°F | Ht 62.0 in | Wt 164.2 lb

## 2023-11-13 DIAGNOSIS — E782 Mixed hyperlipidemia: Secondary | ICD-10-CM | POA: Diagnosis not present

## 2023-11-13 DIAGNOSIS — F5104 Psychophysiologic insomnia: Secondary | ICD-10-CM | POA: Diagnosis not present

## 2023-11-13 MED ORDER — ROSUVASTATIN CALCIUM 10 MG PO TABS: 10.0000 mg | ORAL_TABLET | Freq: Every day | ORAL | 4 refills | Status: DC

## 2023-11-13 NOTE — Assessment & Plan Note (Signed)
Chronic, discussed with patient and educated on ASCVD.  Will start Rosuvastatin 10 MG at night, educated her on this medication and side effects.  Plan on recheck of lipid panel next visit.

## 2023-11-13 NOTE — Assessment & Plan Note (Signed)
Chronic and improved with low dose Ambien.  After loss of husband in March 2024 started.  Melatonin offered no benefit and Trazodone did not work at low dose, but at next dose up made her too groggy.  Not to use Tylenol PM due to Benadryl and BEERS criteria. Insurance would not cover Belsomra, but would cover Ambien and Temazepam as alternatives.  Will continue Ambien 5 MG nightly and recommend she try to take this as little as possible.  Discussed with her, she is aware of BEERS criteria on these, however insurance will not cover Belsomra until she has tried alternatives they listed.

## 2023-11-13 NOTE — Progress Notes (Signed)
BP 131/71   Pulse 84   Temp 98 F (36.7 C) (Oral)   Ht 5\' 2"  (1.575 m)   Wt 164 lb 3.2 oz (74.5 kg)   SpO2 96%   BMI 30.03 kg/m    Subjective:    Patient ID: Charlotte Reyes, female    DOB: 1950-12-31, 73 y.o.   MRN: [id number]  HPI: Charlotte Reyes is a 73 y.o. female  Chief Complaint  Patient presents with   Hyperlipidemia   Insomnia    Patient states she the Ambien has been helping her sleep   HYPERLIPIDEMIA No current medications. Hyperlipidemia status: initiating medication Past cholesterol meds: none Supplements: none Aspirin:  no The 10-year ASCVD risk score (Arnett DK, et al., 2019) is: 13.8%   Values used to calculate the score:     Age: 70 years     Sex: Female     Is Non-Hispanic African American: No     Diabetic: No     Tobacco smoker: No     Systolic Blood Pressure: 131 mmHg     Is BP treated: No     HDL Cholesterol: 67 mg/dL     Total Cholesterol: 234 mg/dL Chest pain:  no Coronary artery disease:  no Family history CAD:  yes - mother had a pacemaker Family history early CAD:  no   INSOMNIA Follow-up today for insomnia.  Started Ambien 5 MG, max dose for age, at last visit due to insurance not covering Belsomra.  Lost husband in March 2024.  Had no benefit from Trazodone or Melatonin.  Sleeping well with Ambien, gets 7-8 hours sleep with this.  Has had no ADR.  Taking every night. Duration: years Satisfied with sleep quality: yes Difficulty falling asleep: no Difficulty staying asleep: no Waking a few hours after sleep onset: no Early morning awakenings: no Daytime hypersomnolence: no Wakes feeling refreshed: yes Good sleep hygiene: yes Apnea: no Snoring: no Depressed/anxious mood: no Recent stress: no Restless legs/nocturnal leg cramps: no Chronic pain/arthritis: no History of sleep study: no Treatments attempted: melatonin, benadryl, and ambien , Trazodone  Relevant past medical, surgical, family and social history reviewed and  updated as indicated. Interim medical history since our last visit reviewed. Allergies and medications reviewed and updated.  Review of Systems  Constitutional:  Negative for activity change, appetite change, diaphoresis, fatigue and fever.  Respiratory:  Negative for cough, chest tightness, shortness of breath and wheezing.   Cardiovascular:  Negative for chest pain, palpitations and leg swelling.  Gastrointestinal: Negative.   Neurological: Negative.   Psychiatric/Behavioral:  Negative for decreased concentration, self-injury, sleep disturbance and suicidal ideas. The patient is not nervous/anxious.     Per HPI unless specifically indicated above     Objective:    BP 131/71   Pulse 84   Temp 98 F (36.7 C) (Oral)   Ht 5\' 2"  (1.575 m)   Wt 164 lb 3.2 oz (74.5 kg)   SpO2 96%   BMI 30.03 kg/m   Wt Readings from Last 3 Encounters:  11/13/23 164 lb 3.2 oz (74.5 kg)  09/29/23 162 lb 9.6 oz (73.8 kg)  09/02/23 161 lb (73 kg)    Physical Exam Vitals and nursing note reviewed.  Constitutional:      General: She is awake. She is not in acute distress.    Appearance: She is well-developed and well-groomed. She is not ill-appearing or toxic-appearing.  HENT:     Head: Normocephalic.     Right Ear:  Hearing and external ear normal.     Left Ear: Hearing and external ear normal.  Eyes:     General: Lids are normal.        Right eye: No discharge.        Left eye: No discharge.     Conjunctiva/sclera: Conjunctivae normal.     Pupils: Pupils are equal, round, and reactive to light.  Neck:     Thyroid: No thyromegaly.     Vascular: No carotid bruit.  Cardiovascular:     Rate and Rhythm: Normal rate and regular rhythm.     Heart sounds: Normal heart sounds. No murmur heard.    No gallop.  Pulmonary:     Effort: Pulmonary effort is normal. No accessory muscle usage or respiratory distress.     Breath sounds: Normal breath sounds.  Abdominal:     General: Bowel sounds are  normal. There is no distension.     Palpations: Abdomen is soft.     Tenderness: There is no abdominal tenderness.  Musculoskeletal:     Cervical back: Normal range of motion and neck supple.     Right lower leg: No edema.     Left lower leg: No edema.  Lymphadenopathy:     Cervical: No cervical adenopathy.  Skin:    General: Skin is warm and dry.  Neurological:     Mental Status: She is alert and oriented to person, place, and time.     Deep Tendon Reflexes: Reflexes are normal and symmetric.     Reflex Scores:      Brachioradialis reflexes are 2+ on the right side and 2+ on the left side.      Patellar reflexes are 2+ on the right side and 2+ on the left side. Psychiatric:        Attention and Perception: Attention normal.        Mood and Affect: Mood normal.        Speech: Speech normal.        Behavior: Behavior normal. Behavior is cooperative.        Thought Content: Thought content normal.     Results for orders placed or performed during the hospital encounter of 10/31/23  Cervicovaginal ancillary only   Collection Time: 10/31/23  1:35 PM  Result Value Ref Range   Bacterial Vaginitis (gardnerella) Negative    Candida Vaginitis Negative    Candida Glabrata Negative    Comment      Normal Reference Range Bacterial Vaginosis - Negative   Comment Normal Reference Range Candida Species - Negative    Comment Normal Reference Range Candida Galbrata - Negative   POCT urinalysis dipstick   Collection Time: 10/31/23  1:40 PM  Result Value Ref Range   Color, UA yellow    Clarity, UA clear    Glucose, UA negative mg/dL   Bilirubin, UA negative    Ketones, POC UA negative mg/dL   Spec Grav, UA 1.610    Blood, UA trace-intact (A)    pH, UA 7.0    Protein Ur, POC negative mg/dL   Urobilinogen, UA 0.2 E.U./dL   Nitrite, UA Negative    Leukocytes, UA Negative   Urine Culture   Collection Time: 10/31/23  2:38 PM   Specimen: Urine, Clean Catch  Result Value Ref Range    Specimen Description URINE, CLEAN CATCH    Special Requests      NONE Performed at [facility], [address], [place], [place]  [address]    Culture >=100,000 COLONIES/mL KLEBSIELLA PNEUMONIAE (A)    Report Status 11/02/2023 FINAL    Organism ID, Bacteria KLEBSIELLA PNEUMONIAE (A)       Susceptibility   Klebsiella pneumoniae - MIC*    AMPICILLIN RESISTANT Resistant     CEFAZOLIN <=4 SENSITIVE Sensitive     CEFEPIME <=0.12 SENSITIVE Sensitive     CEFTRIAXONE <=0.25 SENSITIVE Sensitive     CIPROFLOXACIN <=0.25 SENSITIVE Sensitive     GENTAMICIN <=1 SENSITIVE Sensitive     IMIPENEM <=0.25 SENSITIVE Sensitive     NITROFURANTOIN <=16 SENSITIVE Sensitive     TRIMETH/SULFA <=20 SENSITIVE Sensitive     AMPICILLIN/SULBACTAM <=2 SENSITIVE Sensitive     PIP/TAZO <=4 SENSITIVE Sensitive ug/mL    * >=100,000 COLONIES/mL KLEBSIELLA PNEUMONIAE      Assessment & Plan:   Problem List Items Addressed This Visit       Other   Insomnia - Primary   Chronic and improved with low dose Ambien.  After loss of husband in March 2024 started.  Melatonin offered no benefit and Trazodone did not work at low dose, but at next dose up made her too groggy.  Not to use Tylenol PM due to Benadryl and BEERS criteria. Insurance would not cover Belsomra, but would cover Ambien and Temazepam as alternatives.  Will continue Ambien 5 MG nightly and recommend she try to take this as little as possible.  Discussed with her, she is aware of BEERS criteria on these, however insurance will not cover Belsomra until she has tried alternatives they listed.        Mixed hyperlipidemia   Chronic, discussed with patient and educated on ASCVD.  Will start Rosuvastatin 10 MG at night, educated her on this medication and side effects.  Plan on recheck of lipid panel next visit.      Relevant Medications   rosuvastatin (CRESTOR) 10 MG tablet     Follow up plan: Return for as scheduled February 18th.

## 2023-11-21 ENCOUNTER — Encounter: Payer: Self-pay | Admitting: *Deleted

## 2023-11-21 ENCOUNTER — Encounter
Admission: RE | Disposition: A | Discharge status: Home / Self Care | Payer: Self-pay | Source: Ambulatory Visit | Attending: Gastroenterology

## 2023-11-21 ENCOUNTER — Ambulatory Visit: Payer: HMO | Admitting: Certified Registered"

## 2023-11-21 ENCOUNTER — Other Ambulatory Visit: Payer: Self-pay

## 2023-11-21 ENCOUNTER — Ambulatory Visit
Admission: RE | Admit: 2023-11-21 | Discharge: 2023-11-21 | Disposition: A | Discharge status: Home / Self Care | Payer: HMO | Source: Ambulatory Visit | Attending: Gastroenterology | Admitting: Gastroenterology

## 2023-11-21 DIAGNOSIS — Z800 Family history of malignant neoplasm of digestive organs: Secondary | ICD-10-CM | POA: Insufficient documentation

## 2023-11-21 DIAGNOSIS — E039 Hypothyroidism, unspecified: Secondary | ICD-10-CM | POA: Insufficient documentation

## 2023-11-21 DIAGNOSIS — K219 Gastro-esophageal reflux disease without esophagitis: Secondary | ICD-10-CM | POA: Insufficient documentation

## 2023-11-21 DIAGNOSIS — K5730 Diverticulosis of large intestine without perforation or abscess without bleeding: Secondary | ICD-10-CM | POA: Insufficient documentation

## 2023-11-21 DIAGNOSIS — K640 First degree hemorrhoids: Secondary | ICD-10-CM | POA: Insufficient documentation

## 2023-11-21 DIAGNOSIS — K56699 Other intestinal obstruction unspecified as to partial versus complete obstruction: Secondary | ICD-10-CM | POA: Diagnosis not present

## 2023-11-21 HISTORY — PX: FLEXIBLE SIGMOIDOSCOPY: SHX5431

## 2023-11-21 HISTORY — PX: SUBMUCOSAL TATTOO INJECTION: SHX6856

## 2023-11-21 SURGERY — SIGMOIDOSCOPY, FLEXIBLE
Anesthesia: General

## 2023-11-21 MED ORDER — SODIUM CHLORIDE 0.9 % IV SOLN
INTRAVENOUS | Status: DC
Start: 2023-11-21 — End: 2023-11-21

## 2023-11-21 MED ORDER — PROPOFOL 10 MG/ML IV BOLUS
INTRAVENOUS | Status: DC | PRN
  Administered 2023-11-21: 20 mg via INTRAVENOUS
  Administered 2023-11-21: 40 mg via INTRAVENOUS
  Administered 2023-11-21 (×2): 20 mg via INTRAVENOUS
  Administered 2023-11-21: 40 mg via INTRAVENOUS
  Administered 2023-11-21: 20 mg via INTRAVENOUS

## 2023-11-21 MED ORDER — SPOT INK MARKER SYRINGE KIT
PACK | SUBMUCOSAL | Status: DC | PRN
  Administered 2023-11-21: 3 mL via SUBMUCOSAL

## 2023-11-21 MED ORDER — LIDOCAINE HCL (PF) 1 % IJ SOLN
INTRAMUSCULAR | Status: DC | PRN
  Administered 2023-11-21: 40 mg

## 2023-11-21 NOTE — Op Note (Signed)
 [facility] Patient Name: Charlotte Reyes Procedure Date: 11/21/2023 1:29 PM MRN: [id number] Account #: [id number] Date of Birth: Nov 16, 1950 Admit Type: Outpatient Age: 73 Room: [facility] ENDO ROOM 3 Gender: Female Note Status: Finalized Instrument Name: Peds Colonoscope [id number] Procedure:             Flexible Sigmoidoscopy Indications:           Abnormal virtual colonoscopy Providers:             Ole Schick MD, MD Referring MD:          Melanie DASEN. Valerio (Referring MD) Medicines:             Monitored Anesthesia Care Complications:         No immediate complications. Procedure:             Pre-Anesthesia Assessment:                        - Prior to the procedure, a History and Physical was                         performed, and patient medications and allergies were                         reviewed. The patient is competent. The risks and                         benefits of the procedure and the sedation options and                         risks were discussed with the patient. All questions                         were answered and informed consent was obtained.                         Patient identification and proposed procedure were                         verified by the physician, the nurse, the                         anesthesiologist, the anesthetist and the technician                         in the endoscopy suite. Mental Status Examination:                         alert and oriented. Airway Examination: normal                         oropharyngeal airway and neck mobility. Respiratory                         Examination: clear to auscultation. CV Examination:                         normal. Prophylactic Antibiotics: The patient does not  require prophylactic antibiotics. Prior                         Anticoagulants: The patient has taken no anticoagulant                         or antiplatelet agents. ASA Grade  Assessment: II - A                         patient with mild systemic disease. After reviewing                         the risks and benefits, the patient was deemed in                         satisfactory condition to undergo the procedure. The                         anesthesia plan was to use monitored anesthesia care                         (MAC). Immediately prior to administration of                         medications, the patient was re-assessed for adequacy                         to receive sedatives. The heart rate, respiratory                         rate, oxygen saturations, blood pressure, adequacy of                         pulmonary ventilation, and response to care were                         monitored throughout the procedure. The physical                         status of the patient was re-assessed after the                         procedure.                        After obtaining informed consent, the scope was passed                         under direct vision. The Colonoscope was introduced                         through the anus and advanced to the the sigmoid                         colon. The flexible sigmoidoscopy was accomplished                         without difficulty. The patient tolerated the  procedure well. The quality of the bowel preparation                         was good. Findings:      The perianal and digital rectal examinations were normal.      A severe stenosis was found in the sigmoid colon and was non-traversed.       Area was tattooed with an injection of [place] ink. There was no abnormal       mucosa in the area of the stenosis, just a number of diverticulum. The       stenosis was about 25-30 cm from anal verge.      Multiple small-mouthed diverticula were found in the sigmoid colon.      Internal hemorrhoids were found during retroflexion. The hemorrhoids       were Grade I (internal hemorrhoids that do not  prolapse). Impression:            - Stricture in the sigmoid colon. Tattooed.                        - Diverticulosis in the sigmoid colon.                        - Internal hemorrhoids.                        - No specimens collected. Recommendation:        - Discharge patient to home.                        - Resume previous diet.                        - Continue present medications.                        - Refer to a colo-rectal surgeon at appointment to be                         scheduled. Procedure Code(s):     --- Professional ---                        [phone], Sigmoidoscopy, flexible; with directed                         submucosal injection(s), any substance Diagnosis Code(s):     --- Professional ---                        X43.300, Other intestinal obstruction unspecified as                         to partial versus complete obstruction                        K64.0, First degree hemorrhoids                        K57.30, Diverticulosis of large intestine without                         perforation or  abscess without bleeding                        R93.3, Abnormal findings on diagnostic imaging of                         other parts of digestive tract CPT copyright 2022 [employer]. All rights reserved. The codes documented in this report are preliminary and upon coder review may  be revised to meet current compliance requirements. Ole Schick MD, MD 11/21/2023 2:07:18 PM Number of Addenda: 0 Note Initiated On: 11/21/2023 1:29 PM Total Procedure Duration: 0 hours 9 minutes 45 seconds  Estimated Blood Loss:  Estimated blood loss: none.      [facility]

## 2023-11-21 NOTE — Anesthesia Preprocedure Evaluation (Signed)
 Anesthesia Evaluation  Patient identified by MRN, date of birth, ID band Patient awake    Reviewed: Allergy & Precautions, NPO status , Patient's Chart, lab work & pertinent test results  Airway Mallampati: II  TM Distance: >3 FB Neck ROM: full    Dental  (+) Chipped, Dental Advidsory Given   Pulmonary neg pulmonary ROS   Pulmonary exam normal        Cardiovascular negative cardio ROS Normal cardiovascular exam     Neuro/Psych negative neurological ROS  negative psych ROS   GI/Hepatic Neg liver ROS,GERD  Medicated and Controlled,,  Endo/Other  Hypothyroidism    Renal/GU negative Renal ROS  negative genitourinary   Musculoskeletal   Abdominal   Peds  Hematology negative hematology ROS (+)   Anesthesia Other Findings Past Medical History: 1998: Breast cancer (HCC)     Comment:  left No date: Cancer (HCC) No date: Diverticulitis No date: Generalized headaches     Comment:  may be due to stress No date: GERD (gastroesophageal reflux disease) 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 No date: Hypothyroidism No date: Sebaceous cyst     Comment:  on back No date: Thyroid  disease  Past Surgical History: 09/02/2017: COLONOSCOPY WITH PROPOFOL ; N/A     Comment:  Procedure: COLONOSCOPY WITH PROPOFOL ;  Surgeon:               Gaylyn Gladis PENNER, MD;  Location: [facility] ENDOSCOPY;                Service: Endoscopy;  Laterality: N/A; 09/02/2023: COLONOSCOPY WITH PROPOFOL ; N/A     Comment:  Procedure: COLONOSCOPY WITH PROPOFOL ;  Surgeon:               Maryruth Ole DASEN, MD;  Location: [facility] ENDOSCOPY;                Service: Endoscopy;  Laterality: N/A; No date: INCISE AND DRAIN ABCESS     Comment:  sebaceous cyst on back 1998: MASTECTOMY     Comment:  left 1994: TOTAL ABDOMINAL HYSTERECTOMY  BMI    Body Mass Index: 28.94 kg/m      Reproductive/Obstetrics negative OB ROS                              Anesthesia Physical Anesthesia Plan  ASA: 2  Anesthesia Plan: General   Post-op Pain Management: Minimal or no pain anticipated   Induction: Intravenous  PONV Risk Score and Plan: 3 and Propofol  infusion, TIVA and Ondansetron   Airway Management Planned: Nasal Cannula  Additional Equipment: None  Intra-op Plan:   Post-operative Plan:   Informed Consent: I have reviewed the patients History and Physical, chart, labs and discussed the procedure including the risks, benefits and alternatives for the proposed anesthesia with the patient or authorized representative who has indicated his/her understanding and acceptance.     Dental advisory given  Plan Discussed with: CRNA and Surgeon  Anesthesia Plan Comments: (Discussed risks of anesthesia with patient, including possibility of difficulty with spontaneous ventilation under anesthesia necessitating airway intervention, PONV, and rare risks such as cardiac or respiratory or neurological events, and allergic reactions. Discussed the role of CRNA in patient's perioperative care. Patient understands.)       Anesthesia Quick Evaluation

## 2023-11-21 NOTE — H&P (Signed)
 Outpatient short stay form Pre-procedure 11/21/2023  Charlotte ONEIDA Schick, MD  Primary Physician: Valerio Melanie ONEIDA, NP  Reason for visit:  Abnormal imaging  History of present illness:   73 y/o lady with history of hypothyroidism here for flex sig for abnormal imaging showing sigmoid stricture. No blood thinners. Father with colon cancer around 69. History of hysterectomy.     Current Facility-Administered Medications:    0.9 %  sodium chloride  infusion, , Intravenous, Continuous, Ethelean Colla, Charlotte ONEIDA, MD, Last Rate: 20 mL/hr at 11/21/23 1324, Continued from Pre-op at 11/21/23 1324  Medications Prior to Admission  Medication Sig Dispense Refill Last Dose/Taking   levothyroxine  (SYNTHROID ) 100 MCG tablet Take 100 mcg by mouth daily before breakfast.   11/21/2023 at  8:00 AM   MAGNESIUM PO Take 1 tablet by mouth daily at 2 PM. Patient unsure of dose   11/20/2023   pantoprazole  (PROTONIX ) 40 MG tablet Take 1 tablet by mouth daily.   11/20/2023   POTASSIUM PO Take 1 tablet by mouth daily. Patient unsure of dose   11/20/2023   rosuvastatin  (CRESTOR ) 10 MG tablet Take 1 tablet (10 mg total) by mouth daily. 60 tablet 4 11/20/2023   TURMERIC PO Take 2 tablets by mouth daily at 2 PM. Patient unsure of dose   11/20/2023   zolpidem  (AMBIEN ) 5 MG tablet Take 1 tablet (5 mg total) by mouth at bedtime as needed for sleep. 30 tablet 0 11/20/2023   fexofenadine (ALLEGRA) 180 MG tablet Take 180 mg by mouth daily as needed for allergies.        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    Physical Exam  Gen: Alert,  oriented. Appears stated age.  HEENT: PERRLA. Lungs: No respiratory distress CV: RRR Abd: soft, benign, no masses Ext: No edema    Planned procedures: Proceed with colonoscopy. The patient understands the nature of the planned procedure, indications, risks, alternatives and potential complications including but not limited to bleeding, infection, perforation, damage to internal organs and possible oversedation/side effects from anesthesia. The patient agrees and gives consent to proceed.  Please refer to procedure notes for findings, recommendations and patient disposition/instructions.    Charlotte ONEIDA Schick, MD [facility]

## 2023-11-21 NOTE — Interval H&P Note (Signed)
 History and Physical Interval Note:  11/21/2023 1:39 PM  Charlotte Reyes  has presented today for surgery, with the diagnosis of Abnormal CT Colonography.  The various methods of treatment have been discussed with the patient and family. After consideration of risks, benefits and other options for treatment, the patient has consented to  Procedure(s): FLEXIBLE SIGMOIDOSCOPY (N/A) as a surgical intervention.  The patient's history has been reviewed, patient examined, no change in status, stable for surgery.  I have reviewed the patient's chart and labs.  Questions were answered to the patient's satisfaction.     Charlotte Reyes  Ok to proceed with flex sig

## 2023-11-21 NOTE — Transfer of Care (Signed)
 Immediate Anesthesia Transfer of Care Note  Patient: Charlotte Reyes  Procedure(s) Performed: FLEXIBLE SIGMOIDOSCOPY  Patient Location: PACU and Endoscopy Unit  Anesthesia Type:MAC  Level of Consciousness: awake and alert   Airway & Oxygen Therapy: Patient Spontanous Breathing and Patient connected to nasal cannula oxygen  Post-op Assessment: Report given to RN and Post -op Vital signs reviewed and stable  Post vital signs: Reviewed and stable  Last Vitals:  Vitals Value Taken Time  BP 113/56   Temp    Pulse 78   Resp 14   SpO2 98     Last Pain:  Vitals:   11/21/23 1253  TempSrc: Temporal  PainSc: 0-No pain         Complications: No notable events documented.

## 2023-11-23 NOTE — Anesthesia Postprocedure Evaluation (Signed)
 Anesthesia Post Note  Patient: Charlotte Reyes  Procedure(s) Performed: FLEXIBLE SIGMOIDOSCOPY SUBMUCOSAL TATTOO INJECTION  Patient location during evaluation: Endoscopy Anesthesia Type: General Level of consciousness: awake and alert Pain management: pain level controlled Vital Signs Assessment: post-procedure vital signs reviewed and stable Respiratory status: spontaneous breathing, nonlabored ventilation, respiratory function stable and patient connected to nasal cannula oxygen Cardiovascular status: blood pressure returned to baseline and stable Postop Assessment: no apparent nausea or vomiting Anesthetic complications: no   No notable events documented.   Last Vitals:  Vitals:   11/21/23 1409 11/21/23 1419  BP: (!) 96/41 (!) 158/68  Pulse: 87 86  Resp: 18 20  Temp:    SpO2: 100% 100%    Last Pain:  Vitals:   11/22/23 0903  TempSrc:   PainSc: 0-No pain                 Debby Mines

## 2023-11-24 ENCOUNTER — Encounter: Payer: Self-pay | Admitting: Gastroenterology

## 2023-12-01 DIAGNOSIS — K56699 Other intestinal obstruction unspecified as to partial versus complete obstruction: Secondary | ICD-10-CM | POA: Diagnosis not present

## 2023-12-01 DIAGNOSIS — Z8719 Personal history of other diseases of the digestive system: Secondary | ICD-10-CM | POA: Diagnosis not present

## 2023-12-02 ENCOUNTER — Encounter: Payer: Self-pay | Admitting: Nurse Practitioner

## 2023-12-05 ENCOUNTER — Other Ambulatory Visit: Payer: Self-pay | Admitting: Nurse Practitioner

## 2023-12-05 ENCOUNTER — Other Ambulatory Visit: Payer: Self-pay | Admitting: Urology

## 2023-12-05 MED ORDER — ZOLPIDEM TARTRATE 5 MG PO TABS: 5.0000 mg | ORAL_TABLET | Freq: Every evening | ORAL | 2 refills | Status: DC | PRN

## 2023-12-19 NOTE — Progress Notes (Signed)
 Surgery orders requested via Epic inbox.

## 2023-12-23 ENCOUNTER — Ambulatory Visit: Payer: Self-pay | Admitting: Surgery

## 2023-12-23 DIAGNOSIS — Z01818 Encounter for other preprocedural examination: Secondary | ICD-10-CM

## 2023-12-23 DIAGNOSIS — R739 Hyperglycemia, unspecified: Secondary | ICD-10-CM

## 2023-12-23 NOTE — Telephone Encounter (Signed)
 Resolved

## 2023-12-25 NOTE — Progress Notes (Signed)
 COVID Vaccine Completed: yes  Date of COVID positive in last 90 days:  PCP - Aura Dials, NP Cardiologist -   Chest x-ray -  EKG -  Stress Test -  ECHO -  Cardiac Cath -  Pacemaker/ICD device last checked: Spinal Cord Stimulator:  Bowel Prep -   Sleep Study -  CPAP -   Fasting Blood Sugar -  Checks Blood Sugar _____ times a day  Last dose of GLP1 agonist-  N/A GLP1 instructions:  Hold 7 days before surgery    Last dose of SGLT-2 inhibitors-  N/A SGLT-2 instructions:  Hold 3 days before surgery    Blood Thinner Instructions:  Last dose:   Time: Aspirin Instructions: Last Dose:  Activity level:  Can go up a flight of stairs and perform activities of daily living without stopping and without symptoms of chest pain or shortness of breath.  Able to exercise without symptoms  Unable to go up a flight of stairs without symptoms of     Anesthesia review:   Patient denies shortness of breath, fever, cough and chest pain at PAT appointment  Patient verbalized understanding of instructions that were given to them at the PAT appointment. Patient was also instructed that they will need to review over the PAT instructions again at home before surgery.

## 2023-12-26 ENCOUNTER — Encounter (HOSPITAL_COMMUNITY)
Admission: RE | Admit: 2023-12-26 | Discharge: 2023-12-26 | Disposition: A | Discharge status: Home / Self Care | Payer: HMO | Source: Ambulatory Visit | Attending: Surgery | Admitting: Surgery

## 2023-12-26 ENCOUNTER — Other Ambulatory Visit: Payer: Self-pay

## 2023-12-26 ENCOUNTER — Encounter (HOSPITAL_COMMUNITY): Payer: Self-pay

## 2023-12-26 DIAGNOSIS — Z01812 Encounter for preprocedural laboratory examination: Secondary | ICD-10-CM | POA: Diagnosis not present

## 2023-12-26 DIAGNOSIS — Z01818 Encounter for other preprocedural examination: Secondary | ICD-10-CM

## 2023-12-26 DIAGNOSIS — R739 Hyperglycemia, unspecified: Secondary | ICD-10-CM | POA: Diagnosis not present

## 2023-12-26 LAB — COMPREHENSIVE METABOLIC PANEL
ALT: 23 U/L (ref 0–44)
AST: 21 U/L (ref 15–41)
Albumin: 4.4 g/dL (ref 3.5–5.0)
Alkaline Phosphatase: 58 U/L (ref 38–126)
Anion gap: 9 (ref 5–15)
BUN: 12 mg/dL (ref 8–23)
CO2: 25 mmol/L (ref 22–32)
Calcium: 8.8 mg/dL — ABNORMAL LOW (ref 8.9–10.3)
Chloride: 102 mmol/L (ref 98–111)
Creatinine, Ser: 0.76 mg/dL (ref 0.44–1.00)
GFR, Estimated: >60 mL/min (ref >60)
Glucose, Bld: 98 mg/dL (ref 70–99)
Potassium: 3.6 mmol/L (ref 3.5–5.1)
Sodium: 136 mmol/L (ref 135–145)
Total Bilirubin: 0.7 mg/dL (ref 0.0–1.2)
Total Protein: 7.4 g/dL (ref 6.5–8.1)

## 2023-12-26 LAB — CBC WITH DIFFERENTIAL/PLATELET
Abs Immature Granulocytes: 0.02 10*3/uL (ref 0.00–0.07)
Basophils Absolute: 0 10*3/uL (ref 0.0–0.1)
Basophils Relative: 1 %
Eosinophils Absolute: 0.1 10*3/uL (ref 0.0–0.5)
Eosinophils Relative: 2 %
HCT: 39 % (ref 36.0–46.0)
Hemoglobin: 12.4 g/dL (ref 12.0–15.0)
Immature Granulocytes: 0 %
Lymphocytes Relative: 22 %
Lymphs Abs: 1.1 10*3/uL (ref 0.7–4.0)
MCH: 30 pg (ref 26.0–34.0)
MCHC: 31.8 g/dL (ref 30.0–36.0)
MCV: 94.2 fL (ref 80.0–100.0)
Monocytes Absolute: 0.7 10*3/uL (ref 0.1–1.0)
Monocytes Relative: 15 %
Neutro Abs: 3.1 10*3/uL (ref 1.7–7.7)
Neutrophils Relative %: 60 %
Platelets: 250 10*3/uL (ref 150–400)
RBC: 4.14 MIL/uL (ref 3.87–5.11)
RDW: 12.9 % (ref 11.5–15.5)
WBC: 5.1 10*3/uL (ref 4.0–10.5)
nRBC: 0 % (ref 0.0–0.2)

## 2023-12-26 LAB — HEMOGLOBIN A1C
Hgb A1c MFr Bld: 5.5 % (ref 4.8–5.6)
Mean Plasma Glucose: 111.15 mg/dL

## 2023-12-26 NOTE — Patient Instructions (Addendum)
 SURGICAL WAITING ROOM VISITATION  Patients having surgery or a procedure may have no more than 2 support people in the waiting area - these visitors may rotate.    Children under the age of 87 must have an adult with them who is not the patient.  Due to an increase in RSV and influenza rates and associated hospitalizations, children ages 35 and under may not visit patients in [facility] hospitals.  Visitors with respiratory illnesses are discouraged from visiting and should remain at home.  If the patient needs to stay at the hospital during part of their recovery, the visitor guidelines for inpatient rooms apply. Pre-op nurse will coordinate an appropriate time for 1 support person to accompany patient in pre-op.  This support person may not rotate.    Please refer to the [facility] website for the visitor guidelines for Inpatients (after your surgery is over and you are in a regular room).    Your procedure is scheduled on: 01/01/24   Report to [facility] Main Entrance    Report to admitting at 5:15 AM   Call this number if you have problems the morning of surgery [phone]   Follow a clear liquid diet the day before surgery.   You may have the following liquids until 4:30 AM DAY OF SURGERY  Water Non-Citrus Juices (without pulp, NO RED-Apple, White grape, White cranberry) Black Coffee (NO MILK/CREAM OR CREAMERS, sugar ok)  Clear Tea (NO MILK/CREAM OR CREAMERS, sugar ok) regular and decaf                             Plain Jell-O (NO RED)                                           Fruit ices (not with fruit pulp, NO RED)                                     Popsicles (NO RED)                                                               Sports drinks like Gatorade (NO RED)              Drink 2 Ensure drinks AT 10:00 PM the night before surgery.        The day of surgery:  Drink ONE (1) Pre-Surgery Clear Ensure at 4:30 AM the morning of surgery. Drink in one  sitting. Do not sip.  This drink was given to you during your hospital  pre-op appointment visit. Nothing else to drink after completing the  Pre-Surgery Clear Ensure.          If you have questions, please contact your surgeon's office.   FOLLOW BOWEL PREP AND ANY ADDITIONAL PRE OP INSTRUCTIONS YOU RECEIVED FROM YOUR SURGEON'S OFFICE!!!     Oral Hygiene is also important to reduce your risk of infection.  Remember - BRUSH YOUR TEETH THE MORNING OF SURGERY WITH YOUR REGULAR TOOTHPASTE  DENTURES WILL BE REMOVED PRIOR TO SURGERY PLEASE DO NOT APPLY "Poly grip" OR ADHESIVES!!!   Stop all vitamins and herbal supplements 7 days before surgery.   Take these medicines the morning of surgery with A SIP OF WATER: Levothyroxine, Pantoprazole, Rosuvastatin              You may not have any metal on your body including hair pins, jewelry, and body piercing             Do not wear make-up, lotions, powders, perfumes/cologne, or deodorant  Do not wear nail polish including gel and S&S, artificial/acrylic nails, or any other type of covering on natural nails including finger and toenails. If you have artificial nails, gel coating, etc. that needs to be removed by a nail salon please have this removed prior to surgery or surgery may need to be canceled/ delayed if the surgeon/ anesthesia feels like they are unable to be safely monitored.   Do not shave  48 hours prior to surgery.    Do not bring valuables to the hospital. [place] [facility] IS NOT             RESPONSIBLE   FOR VALUABLES.   Contacts, glasses, dentures or bridgework may not be worn into surgery.   Bring small overnight bag day of surgery.   DO NOT BRING YOUR HOME MEDICATIONS TO THE HOSPITAL. PHARMACY WILL DISPENSE MEDICATIONS LISTED ON YOUR MEDICATION LIST TO YOU DURING YOUR ADMISSION IN THE HOSPITAL!              Please read over the following fact sheets you were given: IF YOU HAVE QUESTIONS ABOUT  YOUR PRE-OP INSTRUCTIONS PLEASE CALL [phone] Contras    If you received a COVID test during your pre-op visit  it is requested that you wear a mask when out in public, stay away from anyone that may not be feeling well and notify your surgeon if you develop symptoms. If you test positive for Covid or have been in contact with anyone that has tested positive in the last 10 days please notify you surgeon.    [facility] - Preparing for Surgery Before surgery, you can play an important role.  Because skin is not sterile, your skin needs to be as free of germs as possible.  You can reduce the number of germs on your skin by washing with CHG (chlorahexidine gluconate) soap before surgery.  CHG is an antiseptic cleaner which kills germs and bonds with the skin to continue killing germs even after washing. Please DO NOT use if you have an allergy to CHG or antibacterial soaps.  If your skin becomes reddened/irritated stop using the CHG and inform your nurse when you arrive at Short Stay. Do not shave (including legs and underarms) for at least 48 hours prior to the first CHG shower.  You may shave your face/neck.  Please follow these instructions carefully:  1.  Shower with CHG Soap the night before surgery and the  morning of surgery.  2.  If you choose to wash your hair, wash your hair first as usual with your normal  shampoo.  3.  After you shampoo, rinse your hair and body thoroughly to remove the shampoo.                             4.  Use  CHG as you would any other liquid soap.  You can apply chg directly to the skin and wash.  Gently with a scrungie or clean washcloth.  5.  Apply the CHG Soap to your body ONLY FROM THE NECK DOWN.   Do   not use on face/ open                           Wound or open sores. Avoid contact with eyes, ears mouth and   genitals (private parts).                       Wash face,  Genitals (private parts) with your normal soap.             6.  Wash thoroughly, paying  special attention to the area where your    surgery  will be performed.  7.  Thoroughly rinse your body with warm water from the neck down.  8.  DO NOT shower/wash with your normal soap after using and rinsing off the CHG Soap.                9.  Pat yourself dry with a clean towel.            10.  Wear clean pajamas.            11.  Place clean sheets on your bed the night of your first shower and do not  sleep with pets. Day of Surgery : Do not apply any lotions/deodorants the morning of surgery.  Please wear clean clothes to the hospital/surgery center.  FAILURE TO FOLLOW THESE INSTRUCTIONS MAY RESULT IN THE CANCELLATION OF YOUR SURGERY  PATIENT SIGNATURE_________________________________  NURSE SIGNATURE__________________________________  ________________________________________________________________________  Rogelia Mire  An incentive spirometer is a tool that can help keep your lungs clear and active. This tool measures how well you are filling your lungs with each breath. Taking long deep breaths may help reverse or decrease the chance of developing breathing (pulmonary) problems (especially infection) following: A long period of time when you are unable to move or be active. BEFORE THE PROCEDURE  If the spirometer includes an indicator to show your best effort, your nurse or respiratory therapist will set it to a desired goal. If possible, sit up straight or lean slightly forward. Try not to slouch. Hold the incentive spirometer in an upright position. INSTRUCTIONS FOR USE  Sit on the edge of your bed if possible, or sit up as far as you can in bed or on a chair. Hold the incentive spirometer in an upright position. Breathe out normally. Place the mouthpiece in your mouth and seal your lips tightly around it. Breathe in slowly and as deeply as possible, raising the piston or the ball toward the top of the column. Hold your breath for 3-5 seconds or for as long as  possible. Allow the piston or ball to fall to the bottom of the column. Remove the mouthpiece from your mouth and breathe out normally. Rest for a few seconds and repeat Steps 1 through 7 at least 10 times every 1-2 hours when you are awake. Take your time and take a few normal breaths between deep breaths. The spirometer may include an indicator to show your best effort. Use the indicator as a goal to work toward during each repetition. After each set of 10 deep breaths, practice coughing to be sure your lungs are clear.  If you have an incision (the cut made at the time of surgery), support your incision when coughing by placing a pillow or rolled up towels firmly against it. Once you are able to get out of bed, walk around indoors and cough well. You may stop using the incentive spirometer when instructed by your caregiver.  RISKS AND COMPLICATIONS Take your time so you do not get dizzy or light-headed. If you are in pain, you may need to take or ask for pain medication before doing incentive spirometry. It is harder to take a deep breath if you are having pain. AFTER USE Rest and breathe slowly and easily. It can be helpful to keep track of a log of your progress. Your caregiver can provide you with a simple table to help with this. If you are using the spirometer at home, follow these instructions: SEEK MEDICAL CARE IF:  You are having difficultly using the spirometer. You have trouble using the spirometer as often as instructed. Your pain medication is not giving enough relief while using the spirometer. You develop fever of 100.5 F (38.1 C) or higher. SEEK IMMEDIATE MEDICAL CARE IF:  You cough up bloody sputum that had not been present before. You develop fever of 102 F (38.9 C) or greater. You develop worsening pain at or near the incision site. MAKE SURE YOU:  Understand these instructions. Will watch your condition. Will get help right away if you are not doing well or get  worse. Document Released: 02/10/2007 Document Revised: 12/23/2011 Document Reviewed: 04/13/2007 [employer] Patient Information 2014 [employer], [place].   ________________________________________________________________________ WHAT IS A BLOOD TRANSFUSION? Blood Transfusion Information  A transfusion is the replacement of blood or some of its parts. Blood is made up of multiple cells which provide different functions. Red blood cells carry oxygen and are used for blood loss replacement. White blood cells fight against infection. Platelets control bleeding. Plasma helps clot blood. Other blood products are available for specialized needs, such as hemophilia or other clotting disorders. BEFORE THE TRANSFUSION  Who gives blood for transfusions?  Healthy volunteers who are fully evaluated to make sure their blood is safe. This is blood bank blood. Transfusion therapy is the safest it has ever been in the practice of medicine. Before blood is taken from a donor, a complete history is taken to make sure that person has no history of diseases nor engages in risky social behavior (examples are intravenous drug use or sexual activity with multiple partners). The donor's travel history is screened to minimize risk of transmitting infections, such as malaria. The donated blood is tested for signs of infectious diseases, such as HIV and hepatitis. The blood is then tested to be sure it is compatible with you in order to minimize the chance of a transfusion reaction. If you or a relative donates blood, this is often done in anticipation of surgery and is not appropriate for emergency situations. It takes many days to process the donated blood. RISKS AND COMPLICATIONS Although transfusion therapy is very safe and saves many lives, the main dangers of transfusion include:  Getting an infectious disease. Developing a transfusion reaction. This is an allergic reaction to something in the blood you were given. Every  precaution is taken to prevent this. The decision to have a blood transfusion has been considered carefully by your caregiver before blood is given. Blood is not given unless the benefits outweigh the risks. AFTER THE TRANSFUSION Right after receiving a blood transfusion, you will usually feel  much better and more energetic. This is especially true if your red blood cells have gotten low (anemic). The transfusion raises the level of the red blood cells which carry oxygen, and this usually causes an energy increase. The nurse administering the transfusion will monitor you carefully for complications. HOME CARE INSTRUCTIONS  No special instructions are needed after a transfusion. You may find your energy is better. Speak with your caregiver about any limitations on activity for underlying diseases you may have. SEEK MEDICAL CARE IF:  Your condition is not improving after your transfusion. You develop redness or irritation at the intravenous (IV) site. SEEK IMMEDIATE MEDICAL CARE IF:  Any of the following symptoms occur over the next 12 hours: Shaking chills. You have a temperature by mouth above 102 F (38.9 C), not controlled by medicine. Chest, back, or muscle pain. People around you feel you are not acting correctly or are confused. Shortness of breath or difficulty breathing. Dizziness and fainting. You get a rash or develop hives. You have a decrease in urine output. Your urine turns a dark color or changes to pink, red, or brown. Any of the following symptoms occur over the next 10 days: You have a temperature by mouth above 102 F (38.9 C), not controlled by medicine. Shortness of breath. Weakness after normal activity. The white part of the eye turns yellow (jaundice). You have a decrease in the amount of urine or are urinating less often. Your urine turns a dark color or changes to pink, red, or brown. Document Released: 09/27/2000 Document Revised: 12/23/2011 Document Reviewed:  05/16/2008 [facility] Patient Information 2014 [place], [place].  _______________________________________________________________________

## 2023-12-30 NOTE — H&P (Signed)
 H&P Physician requesting consult: Marin Olp  Chief Complaint: Firefly for ureter identification   History of Present Illness: 41 F scheduled for Low anterior resection Dr. Cliffton Asters and his team have requested firefly.   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    Hypothyroidism    Sebaceous cyst    on back   Thyroid disease    Past Surgical History:  Procedure Laterality Date   COLONOSCOPY WITH PROPOFOL N/A 09/02/2017   Procedure: COLONOSCOPY WITH PROPOFOL;  Surgeon: Christena Deem, MD;  Location: [facility] ENDOSCOPY;  Service: Endoscopy;  Laterality: N/A;   COLONOSCOPY WITH PROPOFOL N/A 09/02/2023   Procedure: COLONOSCOPY WITH PROPOFOL;  Surgeon: Regis Bill, MD;  Location: [facility] ENDOSCOPY;  Service: Endoscopy;  Laterality: N/A;   FLEXIBLE SIGMOIDOSCOPY N/A 11/21/2023   Procedure: FLEXIBLE SIGMOIDOSCOPY;  Surgeon: Regis Bill, MD;  Location: [facility] ENDOSCOPY;  Service: Endoscopy;  Laterality: N/A;   INCISE AND DRAIN ABCESS     sebaceous cyst on back   MASTECTOMY  1998   left   SUBMUCOSAL TATTOO INJECTION  11/21/2023   Procedure: SUBMUCOSAL TATTOO INJECTION;  Surgeon: Regis Bill, MD;  Location: [facility] ENDOSCOPY;  Service: Endoscopy;;   TOTAL ABDOMINAL HYSTERECTOMY  1994    Home Medications:  No medications prior to admission.   Allergies:  Allergies  Allergen Reactions   Amoxicillin-Pot Clavulanate Rash    Rash on abdomen and trunk    Family History  Problem Relation Age of Onset   Cancer Mother        ovarian   Cancer Father        colon   Hemophilia Brother    Clotting disorder Brother    Breast cancer Paternal Aunt 87   Social History:  reports that she has never smoked. She has never used smokeless tobacco. She reports current alcohol use. She reports that she  does not use drugs.  ROS: A complete review of systems was performed.  All systems are negative except for pertinent findings as noted. ROS   Physical Exam:  Vital signs in last 24 hours:   General:  Alert and oriented, No acute distress Resp: normal WOB on RA Cards: RRR per monitor   Laboratory Data:  No results found for this or any previous visit (from the past 24 hours). No results found for this or any previous visit (from the past 240 hours). Creatinine: Recent Labs    12/26/23 1353  CREATININE 0.76    Impression/Assessment:   64 F scheduled for Low anterior resection Dr. Cliffton Asters and his team have requested firefly.   We discussed risk benefits alternatives to firefly instillation with retrograde urethrogram.  This included bleeding infection and damage to surrounding structures surrounding structures including ureter as well as urethra.  We discussed the need for stent postoperatively as well as the potential symptoms of stent placement. Patient voiced their understanding and consent was obtained.   Plan:  Proceed with firefly  Adonis Brook 12/30/2023, 6:43 PM

## 2023-12-31 NOTE — Anesthesia Preprocedure Evaluation (Signed)
 Anesthesia Evaluation  Patient identified by MRN, date of birth, ID band Patient awake    Reviewed: Allergy & Precautions, NPO status , Patient's Chart, lab work & pertinent test results  Airway Mallampati: II  TM Distance: >3 FB Neck ROM: Full    Dental no notable dental hx.    Pulmonary neg pulmonary ROS   Pulmonary exam normal        Cardiovascular negative cardio ROS  Rhythm:Regular Rate:Normal     Neuro/Psych  Headaches  negative psych ROS   GI/Hepatic Neg liver ROS,GERD  Medicated,,  Endo/Other  Hypothyroidism    Renal/GU negative Renal ROS  negative genitourinary   Musculoskeletal negative musculoskeletal ROS (+)    Abdominal Normal abdominal exam  (+)   Peds  Hematology negative hematology ROS (+) Lab Results      Component                Value               Date                      WBC                      5.1                 12/26/2023                HGB                      12.4                12/26/2023                HCT                      39.0                12/26/2023                MCV                      94.2                12/26/2023                PLT                      250                 12/26/2023             Lab Results      Component                Value               Date                      NA                       136                 12/26/2023                K  3.6                 12/26/2023                CO2                      25                  12/26/2023                GLUCOSE                  98                  12/26/2023                BUN                      12                  12/26/2023                CREATININE               0.76                12/26/2023                CALCIUM                  8.8 (L)             12/26/2023                EGFR                     72                  08/26/2023                GFRNONAA                 >60                  12/26/2023              Anesthesia Other Findings   Reproductive/Obstetrics                             Anesthesia Physical Anesthesia Plan  ASA: 3  Anesthesia Plan: General   Post-op Pain Management: Celebrex PO (pre-op)* and Tylenol PO (pre-op)*   Induction: Intravenous  PONV Risk Score and Plan: 3 and Ondansetron, Dexamethasone and Treatment may vary due to age or medical condition  Airway Management Planned: Mask and Oral ETT  Additional Equipment: None  Intra-op Plan:   Post-operative Plan: Extubation in OR  Informed Consent: I have reviewed the patients History and Physical, chart, labs and discussed the procedure including the risks, benefits and alternatives for the proposed anesthesia with the patient or authorized representative who has indicated his/her understanding and acceptance.     Dental advisory given  Plan Discussed with: CRNA  Anesthesia Plan Comments:        Anesthesia Quick Evaluation

## 2024-01-01 ENCOUNTER — Other Ambulatory Visit: Payer: Self-pay

## 2024-01-01 ENCOUNTER — Inpatient Hospital Stay (HOSPITAL_COMMUNITY)
Admission: RE | Admit: 2024-01-01 | Discharge: 2024-01-03 | DRG: 331 | Disposition: A | Discharge status: Home / Self Care | Payer: HMO | Attending: Surgery | Admitting: Surgery

## 2024-01-01 ENCOUNTER — Inpatient Hospital Stay (HOSPITAL_COMMUNITY): Payer: Self-pay | Admitting: Anesthesiology

## 2024-01-01 ENCOUNTER — Encounter (HOSPITAL_COMMUNITY): Payer: Self-pay | Admitting: Surgery

## 2024-01-01 ENCOUNTER — Inpatient Hospital Stay (HOSPITAL_COMMUNITY)

## 2024-01-01 ENCOUNTER — Encounter (HOSPITAL_COMMUNITY)
Admission: RE | Disposition: A | Discharge status: Home / Self Care | Payer: Self-pay | Source: Home / Self Care | Attending: Surgery

## 2024-01-01 DIAGNOSIS — D66 Hereditary factor VIII deficiency: Secondary | ICD-10-CM | POA: Diagnosis present

## 2024-01-01 DIAGNOSIS — E785 Hyperlipidemia, unspecified: Secondary | ICD-10-CM | POA: Diagnosis present

## 2024-01-01 DIAGNOSIS — Z853 Personal history of malignant neoplasm of breast: Secondary | ICD-10-CM

## 2024-01-01 DIAGNOSIS — Z1401 Asymptomatic hemophilia A carrier: Secondary | ICD-10-CM

## 2024-01-01 DIAGNOSIS — Z832 Family history of diseases of the blood and blood-forming organs and certain disorders involving the immune mechanism: Secondary | ICD-10-CM

## 2024-01-01 DIAGNOSIS — Z408 Encounter for other prophylactic surgery: Secondary | ICD-10-CM | POA: Diagnosis not present

## 2024-01-01 DIAGNOSIS — K56699 Other intestinal obstruction unspecified as to partial versus complete obstruction: Secondary | ICD-10-CM | POA: Diagnosis not present

## 2024-01-01 DIAGNOSIS — K219 Gastro-esophageal reflux disease without esophagitis: Secondary | ICD-10-CM | POA: Diagnosis not present

## 2024-01-01 DIAGNOSIS — E039 Hypothyroidism, unspecified: Secondary | ICD-10-CM | POA: Diagnosis present

## 2024-01-01 DIAGNOSIS — K5730 Diverticulosis of large intestine without perforation or abscess without bleeding: Secondary | ICD-10-CM | POA: Diagnosis not present

## 2024-01-01 DIAGNOSIS — Z90710 Acquired absence of both cervix and uterus: Secondary | ICD-10-CM | POA: Diagnosis not present

## 2024-01-01 DIAGNOSIS — Z803 Family history of malignant neoplasm of breast: Secondary | ICD-10-CM

## 2024-01-01 DIAGNOSIS — Z8719 Personal history of other diseases of the digestive system: Secondary | ICD-10-CM

## 2024-01-01 DIAGNOSIS — Z9012 Acquired absence of left breast and nipple: Secondary | ICD-10-CM

## 2024-01-01 DIAGNOSIS — Z9049 Acquired absence of other specified parts of digestive tract: Secondary | ICD-10-CM | POA: Diagnosis not present

## 2024-01-01 DIAGNOSIS — E782 Mixed hyperlipidemia: Secondary | ICD-10-CM

## 2024-01-01 DIAGNOSIS — K660 Peritoneal adhesions (postprocedural) (postinfection): Secondary | ICD-10-CM | POA: Diagnosis not present

## 2024-01-01 DIAGNOSIS — Z880 Allergy status to penicillin: Secondary | ICD-10-CM

## 2024-01-01 DIAGNOSIS — K5669 Other partial intestinal obstruction: Secondary | ICD-10-CM | POA: Diagnosis not present

## 2024-01-01 HISTORY — PX: FLEXIBLE SIGMOIDOSCOPY: SHX5431

## 2024-01-01 HISTORY — PX: XI ROBOTIC ASSISTED LOWER ANTERIOR RESECTION: SHX6558

## 2024-01-01 LAB — TYPE AND SCREEN
ABO/RH(D): O NEG
Antibody Screen: NEGATIVE

## 2024-01-01 LAB — ABO/RH: ABO/RH(D): O NEG

## 2024-01-01 SURGERY — RESECTION, RECTUM, LOW ANTERIOR, ROBOT-ASSISTED
Anesthesia: General

## 2024-01-01 MED ORDER — KCL IN DEXTROSE-NACL 20-5-0.45 MEQ/L-%-% IV SOLN
INTRAVENOUS | Status: DC
  Filled 2024-01-01 (×2): qty 1000

## 2024-01-01 MED ORDER — ACETAMINOPHEN 500 MG PO TABS
1000.0000 mg | ORAL_TABLET | Freq: Once | ORAL | Status: AC
  Administered 2024-01-01: 1000 mg via ORAL
  Filled 2024-01-01: qty 2

## 2024-01-01 MED ORDER — HEPARIN SODIUM (PORCINE) 5000 UNIT/ML IJ SOLN
5000.0000 [IU] | Freq: Once | INTRAMUSCULAR | Status: AC
  Administered 2024-01-01: 5000 [IU] via SUBCUTANEOUS
  Filled 2024-01-01: qty 1

## 2024-01-01 MED ORDER — HEPARIN SODIUM (PORCINE) 5000 UNIT/ML IJ SOLN
5000.0000 [IU] | Freq: Three times a day (TID) | INTRAMUSCULAR | Status: DC
  Administered 2024-01-01 – 2024-01-03 (×5): 5000 [IU] via SUBCUTANEOUS
  Filled 2024-01-01 (×5): qty 1

## 2024-01-01 MED ORDER — BUPIVACAINE-EPINEPHRINE (PF) 0.25% -1:200000 IJ SOLN
INTRAMUSCULAR | Status: AC
  Filled 2024-01-01: qty 30

## 2024-01-01 MED ORDER — BUPIVACAINE LIPOSOME 1.3 % IJ SUSP
INTRAMUSCULAR | Status: DC | PRN
  Administered 2024-01-01: 20 mL

## 2024-01-01 MED ORDER — LEVOTHYROXINE SODIUM 100 MCG PO TABS
100.0000 ug | ORAL_TABLET | Freq: Every day | ORAL | Status: DC
  Administered 2024-01-02 – 2024-01-03 (×2): 100 ug via ORAL
  Filled 2024-01-01 (×2): qty 1

## 2024-01-01 MED ORDER — BISACODYL 5 MG PO TBEC: 20.0000 mg | DELAYED_RELEASE_TABLET | Freq: Once | ORAL | Status: DC

## 2024-01-01 MED ORDER — ENSURE PRE-SURGERY PO LIQD
296.0000 mL | Freq: Once | ORAL | Status: DC
  Filled 2024-01-01: qty 296

## 2024-01-01 MED ORDER — CELECOXIB 200 MG PO CAPS
200.0000 mg | ORAL_CAPSULE | Freq: Once | ORAL | Status: AC
  Administered 2024-01-01: 200 mg via ORAL
  Filled 2024-01-01: qty 1

## 2024-01-01 MED ORDER — BUPIVACAINE LIPOSOME 1.3 % IJ SUSP
INTRAMUSCULAR | Status: AC
  Filled 2024-01-01: qty 20

## 2024-01-01 MED ORDER — MIDAZOLAM HCL 5 MG/5ML IJ SOLN
INTRAMUSCULAR | Status: DC | PRN
  Administered 2024-01-01 (×2): 1 mg via INTRAVENOUS

## 2024-01-01 MED ORDER — ACETAMINOPHEN 500 MG PO TABS: 1000.0000 mg | ORAL_TABLET | ORAL | Status: DC

## 2024-01-01 MED ORDER — LACTATED RINGERS IV SOLN: INTRAVENOUS | Status: DC

## 2024-01-01 MED ORDER — ROSUVASTATIN CALCIUM 10 MG PO TABS
10.0000 mg | ORAL_TABLET | Freq: Every day | ORAL | Status: DC
  Administered 2024-01-02 – 2024-01-03 (×2): 10 mg via ORAL
  Filled 2024-01-01 (×2): qty 1

## 2024-01-01 MED ORDER — FENTANYL CITRATE (PF) 100 MCG/2ML IJ SOLN
INTRAMUSCULAR | Status: AC
  Filled 2024-01-01: qty 2

## 2024-01-01 MED ORDER — ROCURONIUM BROMIDE 100 MG/10ML IV SOLN
INTRAVENOUS | Status: DC | PRN
  Administered 2024-01-01: 5 mg via INTRAVENOUS
  Administered 2024-01-01: 60 mg via INTRAVENOUS

## 2024-01-01 MED ORDER — PANTOPRAZOLE SODIUM 40 MG PO TBEC
40.0000 mg | DELAYED_RELEASE_TABLET | Freq: Every day | ORAL | Status: DC
  Administered 2024-01-02 – 2024-01-03 (×2): 40 mg via ORAL
  Filled 2024-01-01 (×2): qty 1

## 2024-01-01 MED ORDER — SODIUM CHLORIDE 0.9 % IV SOLN
1.0000 g | INTRAVENOUS | Status: AC
  Administered 2024-01-01: 1 g via INTRAVENOUS
  Filled 2024-01-01: qty 1000

## 2024-01-01 MED ORDER — OXYCODONE HCL 5 MG/5ML PO SOLN: 5.0000 mg | Freq: Once | ORAL | Status: AC | PRN

## 2024-01-01 MED ORDER — ALVIMOPAN 12 MG PO CAPS: 12.0000 mg | ORAL_CAPSULE | Freq: Two times a day (BID) | ORAL | Status: DC

## 2024-01-01 MED ORDER — DIPHENHYDRAMINE HCL 12.5 MG/5ML PO ELIX: 12.5000 mg | ORAL_SOLUTION | Freq: Four times a day (QID) | ORAL | Status: DC | PRN

## 2024-01-01 MED ORDER — BUPIVACAINE LIPOSOME 1.3 % IJ SUSP: 20.0000 mL | Freq: Once | INTRAMUSCULAR | Status: DC

## 2024-01-01 MED ORDER — SUGAMMADEX SODIUM 200 MG/2ML IV SOLN
INTRAVENOUS | Status: DC | PRN
  Administered 2024-01-01: 200 mg via INTRAVENOUS

## 2024-01-01 MED ORDER — ZOLPIDEM TARTRATE 5 MG PO TABS
5.0000 mg | ORAL_TABLET | Freq: Every evening | ORAL | Status: DC | PRN
  Administered 2024-01-01 – 2024-01-02 (×2): 5 mg via ORAL
  Filled 2024-01-01 (×2): qty 1

## 2024-01-01 MED ORDER — ENSURE SURGERY PO LIQD: 237.0000 mL | Freq: Two times a day (BID) | ORAL | Status: DC

## 2024-01-01 MED ORDER — POLYETHYLENE GLYCOL 3350 17 GM/SCOOP PO POWD: 238.0000 g | Freq: Once | ORAL | Status: DC

## 2024-01-01 MED ORDER — INDOCYANINE GREEN 25 MG IV SOLR
INTRAVENOUS | Status: DC | PRN
  Administered 2024-01-01: 2.5 mg via INTRAVENOUS

## 2024-01-01 MED ORDER — ONDANSETRON HCL 4 MG/2ML IJ SOLN
4.0000 mg | Freq: Four times a day (QID) | INTRAMUSCULAR | Status: DC | PRN
  Administered 2024-01-01 – 2024-01-02 (×2): 4 mg via INTRAVENOUS
  Filled 2024-01-01 (×2): qty 2

## 2024-01-01 MED ORDER — OXYCODONE HCL 5 MG PO TABS
ORAL_TABLET | ORAL | Status: AC
  Filled 2024-01-01: qty 1

## 2024-01-01 MED ORDER — DROPERIDOL 2.5 MG/ML IJ SOLN: 0.6250 mg | Freq: Once | INTRAMUSCULAR | Status: DC | PRN

## 2024-01-01 MED ORDER — ENSURE PRE-SURGERY PO LIQD
592.0000 mL | Freq: Once | ORAL | Status: DC
  Filled 2024-01-01: qty 592

## 2024-01-01 MED ORDER — PHENYLEPHRINE 80 MCG/ML (10ML) SYRINGE FOR IV PUSH (FOR BLOOD PRESSURE SUPPORT)
PREFILLED_SYRINGE | INTRAVENOUS | Status: DC | PRN
  Administered 2024-01-01 (×2): 160 ug via INTRAVENOUS
  Administered 2024-01-01: 120 ug via INTRAVENOUS

## 2024-01-01 MED ORDER — HYDRALAZINE HCL 20 MG/ML IJ SOLN: 10.0000 mg | INTRAMUSCULAR | Status: DC | PRN

## 2024-01-01 MED ORDER — ALVIMOPAN 12 MG PO CAPS
12.0000 mg | ORAL_CAPSULE | ORAL | Status: AC
  Administered 2024-01-01: 12 mg via ORAL
  Filled 2024-01-01: qty 1

## 2024-01-01 MED ORDER — LIDOCAINE HCL (CARDIAC) PF 100 MG/5ML IV SOSY
PREFILLED_SYRINGE | INTRAVENOUS | Status: DC | PRN
  Administered 2024-01-01: 60 mg via INTRAVENOUS

## 2024-01-01 MED ORDER — PHENYLEPHRINE HCL-NACL 20-0.9 MG/250ML-% IV SOLN
INTRAVENOUS | Status: DC | PRN
  Administered 2024-01-01: 30 ug/min via INTRAVENOUS

## 2024-01-01 MED ORDER — HYDROMORPHONE HCL 1 MG/ML IJ SOLN
INTRAMUSCULAR | Status: DC | PRN
  Administered 2024-01-01 (×2): .4 mg via INTRAVENOUS

## 2024-01-01 MED ORDER — ALUM & MAG HYDROXIDE-SIMETH 200-200-20 MG/5ML PO SUSP: 30.0000 mL | Freq: Four times a day (QID) | ORAL | Status: DC | PRN

## 2024-01-01 MED ORDER — ONDANSETRON HCL 4 MG/2ML IJ SOLN
INTRAMUSCULAR | Status: DC | PRN
Start: 2024-01-01 — End: 2024-01-01
  Administered 2024-01-01: 4 mg via INTRAVENOUS

## 2024-01-01 MED ORDER — FENTANYL CITRATE (PF) 100 MCG/2ML IJ SOLN
INTRAMUSCULAR | Status: DC | PRN
  Administered 2024-01-01: 100 ug via INTRAVENOUS

## 2024-01-01 MED ORDER — ORAL CARE MOUTH RINSE: 15.0000 mL | Freq: Once | OROMUCOSAL | Status: AC

## 2024-01-01 MED ORDER — OXYCODONE HCL 5 MG PO TABS
5.0000 mg | ORAL_TABLET | Freq: Once | ORAL | Status: AC | PRN
  Administered 2024-01-01: 5 mg via ORAL

## 2024-01-01 MED ORDER — EPHEDRINE SULFATE-NACL 50-0.9 MG/10ML-% IV SOSY
PREFILLED_SYRINGE | INTRAVENOUS | Status: DC | PRN
  Administered 2024-01-01: 7.5 mg via INTRAVENOUS

## 2024-01-01 MED ORDER — MIDAZOLAM HCL 2 MG/2ML IJ SOLN
INTRAMUSCULAR | Status: AC
  Filled 2024-01-01: qty 2

## 2024-01-01 MED ORDER — TRAMADOL HCL 50 MG PO TABS
50.0000 mg | ORAL_TABLET | Freq: Four times a day (QID) | ORAL | Status: DC | PRN
  Administered 2024-01-02: 50 mg via ORAL
  Filled 2024-01-01 (×3): qty 1

## 2024-01-01 MED ORDER — DEXAMETHASONE SODIUM PHOSPHATE 10 MG/ML IJ SOLN
INTRAMUSCULAR | Status: AC
  Filled 2024-01-01: qty 1

## 2024-01-01 MED ORDER — PHENYLEPHRINE HCL-NACL 20-0.9 MG/250ML-% IV SOLN
INTRAVENOUS | Status: AC
  Filled 2024-01-01: qty 250

## 2024-01-01 MED ORDER — ONDANSETRON HCL 4 MG/2ML IJ SOLN
INTRAMUSCULAR | Status: AC
  Filled 2024-01-01: qty 2

## 2024-01-01 MED ORDER — PROPOFOL 10 MG/ML IV BOLUS
INTRAVENOUS | Status: DC | PRN
  Administered 2024-01-01: 160 mg via INTRAVENOUS
  Administered 2024-01-01: 50 ug/kg/min via INTRAVENOUS

## 2024-01-01 MED ORDER — HYDROMORPHONE HCL 1 MG/ML IJ SOLN: 0.2500 mg | INTRAMUSCULAR | Status: DC | PRN

## 2024-01-01 MED ORDER — SIMETHICONE 80 MG PO CHEW
40.0000 mg | CHEWABLE_TABLET | Freq: Four times a day (QID) | ORAL | Status: DC | PRN
  Filled 2024-01-01: qty 1

## 2024-01-01 MED ORDER — ONDANSETRON HCL 4 MG PO TABS
4.0000 mg | ORAL_TABLET | Freq: Four times a day (QID) | ORAL | Status: DC | PRN
Start: 2024-01-01 — End: 2024-01-03

## 2024-01-01 MED ORDER — CHLORHEXIDINE GLUCONATE CLOTH 2 % EX PADS: 6.0000 | MEDICATED_PAD | Freq: Once | CUTANEOUS | Status: DC

## 2024-01-01 MED ORDER — HYDROMORPHONE HCL 1 MG/ML IJ SOLN: 0.5000 mg | INTRAMUSCULAR | Status: DC | PRN

## 2024-01-01 MED ORDER — HYDROMORPHONE HCL 2 MG/ML IJ SOLN
INTRAMUSCULAR | Status: AC
Start: 2024-01-01 — End: ?
  Filled 2024-01-01: qty 1

## 2024-01-01 MED ORDER — VITAMIN D3 25 MCG (1000 UNIT) PO TABS
2000.0000 [IU] | ORAL_TABLET | Freq: Every day | ORAL | Status: DC
  Administered 2024-01-02 – 2024-01-03 (×2): 2000 [IU] via ORAL
  Filled 2024-01-01 (×2): qty 2

## 2024-01-01 MED ORDER — BUPIVACAINE-EPINEPHRINE (PF) 0.25% -1:200000 IJ SOLN
INTRAMUSCULAR | Status: DC | PRN
  Administered 2024-01-01: 30 mL

## 2024-01-01 MED ORDER — CHLORHEXIDINE GLUCONATE 0.12 % MT SOLN
15.0000 mL | Freq: Once | OROMUCOSAL | Status: AC
  Administered 2024-01-01: 15 mL via OROMUCOSAL

## 2024-01-01 MED ORDER — STERILE WATER FOR INJECTION IJ SOLN
INTRAMUSCULAR | Status: AC
  Filled 2024-01-01: qty 10

## 2024-01-01 MED ORDER — SUGAMMADEX SODIUM 200 MG/2ML IV SOLN
INTRAVENOUS | Status: AC
  Filled 2024-01-01: qty 2

## 2024-01-01 MED ORDER — DIPHENHYDRAMINE HCL 50 MG/ML IJ SOLN: 12.5000 mg | Freq: Four times a day (QID) | INTRAMUSCULAR | Status: DC | PRN

## 2024-01-01 MED ORDER — PROPOFOL 10 MG/ML IV BOLUS
INTRAVENOUS | Status: AC
  Filled 2024-01-01: qty 20

## 2024-01-01 MED ORDER — SODIUM CHLORIDE 0.9 % IR SOLN
Status: DC | PRN
  Administered 2024-01-01: 1000 mL

## 2024-01-01 MED ORDER — IBUPROFEN 400 MG PO TABS
600.0000 mg | ORAL_TABLET | Freq: Four times a day (QID) | ORAL | Status: DC | PRN
  Filled 2024-01-01: qty 1

## 2024-01-01 MED ORDER — DEXAMETHASONE SODIUM PHOSPHATE 10 MG/ML IJ SOLN
INTRAMUSCULAR | Status: DC | PRN
  Administered 2024-01-01: 8 mg via INTRAVENOUS

## 2024-01-01 MED ORDER — ACETAMINOPHEN 500 MG PO TABS
1000.0000 mg | ORAL_TABLET | Freq: Four times a day (QID) | ORAL | Status: DC
  Administered 2024-01-01 – 2024-01-03 (×7): 1000 mg via ORAL
  Filled 2024-01-01 (×7): qty 2

## 2024-01-01 MED ORDER — ROCURONIUM BROMIDE 10 MG/ML (PF) SYRINGE
PREFILLED_SYRINGE | INTRAVENOUS | Status: AC
  Filled 2024-01-01: qty 10

## 2024-01-01 SURGICAL SUPPLY — 112 items
ADAPTER GOLDBERG URETERAL (ADAPTER) IMPLANT
APPLIER CLIP 5 13 M/L LIGAMAX5 (MISCELLANEOUS) IMPLANT
APPLIER CLIP ROT 10 11.4 M/L (STAPLE) IMPLANT
BAG COUNTER SPONGE SURGICOUNT (BAG) IMPLANT
BAG URO CATCHER STRL LF (MISCELLANEOUS) ×1 IMPLANT
BLADE EXTENDED COATED 6.5IN (ELECTRODE) ×1 IMPLANT
CANNULA REDUCER 12-8 DVNC XI (CANNULA) ×1 IMPLANT
CATH URETL OPEN 5X70 (CATHETERS) IMPLANT
CELLS DAT CNTRL 66122 CELL SVR (MISCELLANEOUS) IMPLANT
CHLORAPREP W/TINT 26 (MISCELLANEOUS) ×1 IMPLANT
CLIP APPLIE 5 13 M/L LIGAMAX5 (MISCELLANEOUS) IMPLANT
CLIP APPLIE ROT 10 11.4 M/L (STAPLE) IMPLANT
CLIP LIGATING HEM O LOK PURPLE (MISCELLANEOUS) IMPLANT
CLIP LIGATING HEMO O LOK GREEN (MISCELLANEOUS) IMPLANT
CLOTH BEACON ORANGE TIMEOUT ST (SAFETY) ×1 IMPLANT
COVER SURGICAL LIGHT HANDLE (MISCELLANEOUS) ×2 IMPLANT
COVER TIP SHEARS 8 DVNC (MISCELLANEOUS) ×1 IMPLANT
DEFOGGER SCOPE WARMER CLEARIFY (MISCELLANEOUS) ×1 IMPLANT
DERMABOND ADVANCED .7 DNX12 (GAUZE/BANDAGES/DRESSINGS) IMPLANT
DEVICE TROCAR PUNCTURE CLOSURE (ENDOMECHANICALS) IMPLANT
DRAIN CHANNEL 19F RND (DRAIN) ×1 IMPLANT
DRAPE ARM DVNC X/XI (DISPOSABLE) ×4 IMPLANT
DRAPE COLUMN DVNC XI (DISPOSABLE) ×1 IMPLANT
DRAPE SURG IRRIG POUCH 19X23 (DRAPES) ×1 IMPLANT
DRIVER NDL LRG 8 DVNC XI (INSTRUMENTS) ×1 IMPLANT
DRIVER NDLE LRG 8 DVNC XI (INSTRUMENTS) ×1 IMPLANT
DRSG OPSITE POSTOP 4X10 (GAUZE/BANDAGES/DRESSINGS) IMPLANT
DRSG OPSITE POSTOP 4X6 (GAUZE/BANDAGES/DRESSINGS) IMPLANT
DRSG OPSITE POSTOP 4X8 (GAUZE/BANDAGES/DRESSINGS) IMPLANT
DRSG TEGADERM 2-3/8X2-3/4 SM (GAUZE/BANDAGES/DRESSINGS) ×5 IMPLANT
DRSG TEGADERM 4X4.75 (GAUZE/BANDAGES/DRESSINGS) ×1 IMPLANT
ELECT REM PT RETURN 15FT ADLT (MISCELLANEOUS) ×1 IMPLANT
ENDOLOOP SUT PDS II 0 18 (SUTURE) IMPLANT
EVACUATOR SILICONE 100CC (DRAIN) ×1 IMPLANT
GAUZE SPONGE 2X2 8PLY STRL LF (GAUZE/BANDAGES/DRESSINGS) ×1 IMPLANT
GAUZE SPONGE 4X4 12PLY STRL (GAUZE/BANDAGES/DRESSINGS) IMPLANT
GLOVE BIO SURGEON STRL SZ7.5 (GLOVE) ×3 IMPLANT
GLOVE INDICATOR 8.0 STRL GRN (GLOVE) ×3 IMPLANT
GLOVE SURG LX STRL 8.0 MICRO (GLOVE) ×1 IMPLANT
GOWN SRG XL LVL 4 BRTHBL STRL (GOWNS) ×1 IMPLANT
GOWN STRL REUS W/ TWL XL LVL3 (GOWN DISPOSABLE) ×6 IMPLANT
GRASPER SUT TROCAR 14GX15 (MISCELLANEOUS) IMPLANT
GRASPER TIP-UP FEN DVNC XI (INSTRUMENTS) ×1 IMPLANT
GUIDEWIRE ANG ZIPWIRE 038X150 (WIRE) IMPLANT
GUIDEWIRE STR DUAL SENSOR (WIRE) IMPLANT
HOLDER FOLEY CATH W/STRAP (MISCELLANEOUS) ×1 IMPLANT
IRRIG SUCT STRYKERFLOW 2 WTIP (MISCELLANEOUS) ×1 IMPLANT
IRRIGATION SUCT STRKRFLW 2 WTP (MISCELLANEOUS) ×1 IMPLANT
KIT PROCEDURE DVNC SI (MISCELLANEOUS) ×1 IMPLANT
KIT TURNOVER KIT A (KITS) IMPLANT
MANIFOLD NEPTUNE II (INSTRUMENTS) ×1 IMPLANT
NDL INSUFFLATION 14GA 120MM (NEEDLE) ×1 IMPLANT
NEEDLE INSUFFLATION 14GA 120MM (NEEDLE) ×1 IMPLANT
PACK CARDIOVASCULAR III (CUSTOM PROCEDURE TRAY) ×1 IMPLANT
PACK COLON (CUSTOM PROCEDURE TRAY) ×1 IMPLANT
PACK CYSTO (CUSTOM PROCEDURE TRAY) ×1 IMPLANT
PAD POSITIONING PINK XL (MISCELLANEOUS) ×1 IMPLANT
PENCIL SMOKE EVACUATOR (MISCELLANEOUS) IMPLANT
PROTECTOR NERVE ULNAR (MISCELLANEOUS) ×2 IMPLANT
RELOAD STAPLE 45 3.5 BLU DVNC (STAPLE) IMPLANT
RELOAD STAPLE 45 4.3 GRN DVNC (STAPLE) IMPLANT
RELOAD STAPLE 60 3.5 BLU DVNC (STAPLE) IMPLANT
RELOAD STAPLE 60 4.3 GRN DVNC (STAPLE) IMPLANT
RELOAD STAPLER 3.5X45 BLU DVNC (STAPLE) IMPLANT
RELOAD STAPLER 3.5X60 BLU DVNC (STAPLE) IMPLANT
RELOAD STAPLER 4.3X45 GRN DVNC (STAPLE) IMPLANT
RELOAD STAPLER 4.3X60 GRN DVNC (STAPLE) ×1 IMPLANT
RETRACTOR WND ALEXIS 18 MED (MISCELLANEOUS) IMPLANT
RTRCTR WOUND ALEXIS 18CM MED (MISCELLANEOUS) IMPLANT
SCISSORS LAP 5X35 DISP (ENDOMECHANICALS) IMPLANT
SCISSORS MNPLR CVD DVNC XI (INSTRUMENTS) ×1 IMPLANT
SEAL UNIV 5-12 XI (MISCELLANEOUS) ×4 IMPLANT
SEALER VESSEL EXT DVNC XI (MISCELLANEOUS) ×1 IMPLANT
SLEEVE ADV FIXATION 5X100MM (TROCAR) IMPLANT
SOL ELECTROSURG ANTI STICK (MISCELLANEOUS) ×1 IMPLANT
SOLUTION ELECTROSURG ANTI STCK (MISCELLANEOUS) ×1 IMPLANT
SPIKE FLUID TRANSFER (MISCELLANEOUS) ×1 IMPLANT
STAPLER 60 SUREFORM DVNC (STAPLE) IMPLANT
STAPLER ECHELON POWER CIR 29 (STAPLE) IMPLANT
STAPLER ECHELON POWER CIR 31 (STAPLE) IMPLANT
STAPLER RELOAD 3.5X45 BLU DVNC (STAPLE) IMPLANT
STAPLER RELOAD 3.5X60 BLU DVNC (STAPLE) IMPLANT
STAPLER RELOAD 4.3X45 GRN DVNC (STAPLE) IMPLANT
STAPLER RELOAD 4.3X60 GRN DVNC (STAPLE) ×1 IMPLANT
STOPCOCK 4 WAY LG BORE MALE ST (IV SETS) ×2 IMPLANT
SURGILUBE 2OZ TUBE FLIPTOP (MISCELLANEOUS) ×1 IMPLANT
SUT MNCRL AB 4-0 PS2 18 (SUTURE) ×1 IMPLANT
SUT PDS AB 1 CT1 27 (SUTURE) IMPLANT
SUT PDS AB 1 TP1 96 (SUTURE) IMPLANT
SUT PROLENE 0 CT 2 (SUTURE) IMPLANT
SUT PROLENE 2 0 KS (SUTURE) ×1 IMPLANT
SUT PROLENE 2 0 SH DA (SUTURE) IMPLANT
SUT SILK 2 0 SH CR/8 (SUTURE) IMPLANT
SUT SILK 2-0 18XBRD TIE 12 (SUTURE) IMPLANT
SUT SILK 3 0 SH CR/8 (SUTURE) ×1 IMPLANT
SUT SILK 3-0 18XBRD TIE 12 (SUTURE) ×1 IMPLANT
SUT V-LOC BARB 180 2/0GR6 GS22 (SUTURE) IMPLANT
SUT VIC AB 3-0 SH 18 (SUTURE) IMPLANT
SUT VIC AB 3-0 SH 27XBRD (SUTURE) IMPLANT
SUT VICRYL 0 UR6 27IN ABS (SUTURE) ×1 IMPLANT
SUTURE V-LC BRB 180 2/0GR6GS22 (SUTURE) IMPLANT
SYR 10ML LL (SYRINGE) ×1 IMPLANT
SYS LAPSCP GELPORT 120MM (MISCELLANEOUS) IMPLANT
SYS WOUND ALEXIS 18CM MED (MISCELLANEOUS) ×1 IMPLANT
SYSTEM LAPSCP GELPORT 120MM (MISCELLANEOUS) IMPLANT
SYSTEM WOUND ALEXIS 18CM MED (MISCELLANEOUS) ×1 IMPLANT
TAPE UMBILICAL 1/8 X36 TWILL (MISCELLANEOUS) ×1 IMPLANT
TRAY FOLEY MTR SLVR 16FR STAT (SET/KITS/TRAYS/PACK) ×1 IMPLANT
TROCAR ADV FIXATION 5X100MM (TROCAR) ×1 IMPLANT
TUBING CONNECTING 10 (TUBING) ×4 IMPLANT
TUBING INSUFFLATION 10FT LAP (TUBING) ×1 IMPLANT
TUBING UROLOGY SET (TUBING) IMPLANT

## 2024-01-01 NOTE — Plan of Care (Signed)

## 2024-01-01 NOTE — Op Note (Signed)
 PATIENT: Charlotte Reyes  73 y.o. female  Patient Care Team: Marjie Skiff, NP as PCP - General (Nurse Practitioner)  PREOP DIAGNOSIS: Stricture of sigmoid colon  POSTOP DIAGNOSIS: Stricture of sigmoid colon  PROCEDURE:  Robotic assisted low anterior resection with double stapled colorectal anastomosis at 12 cm Intraoperative assessment of perfusion using ICG fluorescence imaging Flexible sigmoidoscopy Bilateral transversus abdominus plane (TAP) blocks  SURGEON: Stephanie Coup. Nikya Busler, MD  ASSISTANT: Romie Levee, MD  An experienced assistant was required given the complexity of this procedure and the standard of surgical care. My assistant helped with exposure through counter tension, suctioning, ligation and retraction to better visualize the surgical field. My assistant expedited sewing during the case by following my sutures. Wherever I use the term "we" in the report, my assistant actively helped me with that portion of the procedure.   ANESTHESIA: General endotracheal  EBL: 15 mL Total I/O In: -  Out: 365 [Urine:100; Other:250; Blood:15]  DRAINS: None  SPECIMEN: Rectosigmoid colon - open end proximal  COUNTS: Sponge, needle and instrument counts were reported correct x2  FINDINGS: Distal sigmoidal stricture with tattoo as expected distal to this.  The fibrosis extending all way down to the proximalmost aspect of the rectum, necessitating resection of the proximal portion of the rectum as well.  A well perfused, tension free, hemostatic, air tight 29 mm EEA colorectal anastomosis fashioned 12 cm from the anal verge by flexible sigmoidoscopy.   NARRATIVE: Informed consent was verified. The patient was taken to the operating room, placed supine on the operating table and SCD's were applied. General endotracheal anesthesia was induced without difficulty. She was then positioned in the lithotomy position with Allen stirrups.  Pressure points were evaluated and padded.  A  foley catheter was then placed by nursing under sterile conditions. Hair on the abdomen was clipped.  She was secured to the operating table. Dr. Jennette Bill with [facility] scrubbed for his portion of the procedure.  Please refer to his notes for details. The abdomen was then prepped and draped in the standard sterile fashion. Surgical timeout was called indicating the correct patient, procedure, positioning and need for preoperative antibiotics.   An OG tube was placed by anesthesia and confirmed to be to suction.  At Palmer's point, a stab incision was created and the Veress needle was introduced into the peritoneal cavity on the first attempt.  Intraperitoneal location was confirmed by the aspiration and saline drop test.  Pneumoperitoneum was established to a maximum pressure of 15 mmHg using CO2.  Following this, the abdomen was marked for planned trocar sites.  Just to the right and cephalad to the umbilicus, an 8 mm incision was created and an 8 mm blunt tipped robotic trocar was cautiously placed into the peritoneal cavity.  The laparoscope was inserted and demonstrated no evidence of trocar site nor Veress needle site complications.  The Veress needle was removed.  Bilateral transversus abdominis plane blocks were then created using a dilute mixture of Exparel with Marcaine.  3 additional 8 mm robotic trochars were placed under direct visualization roughly in a line extending from the right ASIS towards the left upper quadrant. The bladder was inspected and noted to be at/below the pubic symphysis.  Staying 3 fingerbreadths above the pubic symphysis, an incision was created and the 12 mm robotic trocar inserted directed cephalad into the peritoneal cavity under direct visualization.  An additional 5 mm assist port was placed in the right lateral abdomen under direct visualization.  The abdomen was surveyed and there was some omental adhesions in the lower midline from prior surgery.  Otherwise, the  abdomen is normal in appearance.  She was positioned in Trendelenburg with the left side tilted slightly up.  Small bowel was carefully retracted out of the pelvis.  The robot was then docked and I went to the console.  We began with adhesiolysis. Adhesions consisting of omentum were taken down from abdominal wall.  The cut edges of the omentum were inspected noted to be hemostatic.   The sigmoid colon was readily identified.  There was dense fibrosis and stricturing noted of the distal sigmoid colon.  There is a tattoo noted at the level of the proximal rectum.  The fibrosis extends onto the proximal most aspect of the rectum.  Attachments of the sigmoid colon were taken down from the intersigmoid fossa.  The rectosigmoid colon was grasped and elevated anteriorly.  Beginning with a medial to lateral approach, the peritoneum overlying the presacral space was carefully incised.  The TME plane was readily gained working in a plane between the fascia propria of the rectum and the presacral fascia.  Hypogastric nerves were seen going along the the presacral fascia and were protected free of injury.  Working more proximally, the mesorectum and sigmoid mesentery were carefully mobilized off of the peritoneum.  The left ureter was identified and protected free of injury.  The left gonadal vessels were identified and protected.  These were both swept "down."  The superior hemorrhoidal and IMA pedicles were identified. Further mesocolon was mobilized proximally staying in this plane between the retroperitoneum proper and the mesocolon. Attention was then turned to the lateral portion of dissection.  The sigmoid colon was then retracted to the right.  The sigmoid colon was fully mobilized. The descending colon was mobilized by incising the Evynn Boutelle line of Toldt.  This was done all the way up to the level of the splenic flexure.  The associated mesocolon was also mobilized medially.  The left ureter again was confirmed to  be well away from the vasculature which had been dissected medially.  The rectosigmoid colon was elevated anteriorly. The left ureter was re-identified. The IMA was clear of this.  We then divided the IMA with the vessel sealer. The stump was inspected and noted to be completely hemostatic with a good seal.  The mesentery was divided out to the point of planned proximal division.  Working more distally, the rectum was identified where the tinea had splayed and there were loss of appendices epiploica.  At the proximalmost aspect of this, there is fibrosis.  Therefore we went down 1 or 2 cm beyond this where it is clearly healthy and supple.  This also corresponds to a location overlying the sacral promontory.  Anatomically, this clearly represents the proximal rectum.  The mesentery out to this level was then cleared using the vessel sealer. The distal point of transection on the proximal rectum was identified.   A 60 mm green load robotic stapler was then placed through the 12 mm port and introduced into the peritoneal cavity.  The rectum was divided with a single firing of the stapler.  The stump was intact and healthy in appearance.   Attention was turned to performing a perfusion test. ICG was administered by anesthesia and at the level of the cleared mesentery proximally, there was excellent uptake of the tracer.  The rectum was also well perfused in appearance.  There was a visible pulse in  the mesentery out to the level of the cleared colon at the level of the proximal sigmoid/descending colon junction.  This colon is also supple and healthy in appearance without any thickening.  This reached into the pelvis without any difficulty and remained in that location without any tension. A locking grasper was then placed on the sigmoid staple line.   Attention was turned to the extracorporeal portion of the procedure.  The robot was undocked.  I scrubbed back in.  Using the 12 mm trocar site, a Pfannenstiel  incision was created and incorporated the fascial opening through the 12 mm port site.  The rectus fascia was incised and then elevated.  The rectus muscle was mobilized free of the overlying fascia.  The peritoneum was incised in the midline well above the location of the bladder.  An Alexis wound protector was placed.  Towels were placed around the field.  The divided colon was passed through the wound protector.  The point of proximal division was identified and was again on a healthy segment of supple colon with a palpable pulse in the mesentery. This was pink in color.  A pursestring device was applied.  A 2-0 Prolene on a Keith needle was passed.  The colon was divided and passed off with the open end being proximal.  EEA sizers were then introduced and a 29 mm EEA selected.  "Belt loops" consisting of 3-0 silk were placed around the pursestring suture line.  The anvil was placed and the pursestring tied.  A small amount of fat was cleared from the planned anastomosis and no diverticula were apparent within this.  This was placed back into the abdomen and a cap placed over the wound protector port site.  Pneumoperitoneum was reestablished.  I then went below to pass the stapler.  My partner remained above.  EEA sizers were cautiously introduced via the anus and advanced under direct visualization.  The stapler was passed and the spike deployed just anterior to the staple line.  The components were then mated.  Orientation was confirmed such that there is no twisting of the colon nor small bowel underneath the mesenteric defect. Care was taken to ensure no other structures were incorporated within this either.  The stapler was then closed, held, and fired. This was then removed. The donuts were inspected and noted to be complete.  The colon proximal to the anastomosis was then gently occluded. The pelvis was filled with sterile irrigation. Under direct visualization, I passed a flexible sigmoidoscope.  The  anastomosis was under water.  With good distention of the anastomosis there was no air leak. The anastomosis pink in appearance.  This is located at 12 cm from the anal verge by flexible sigmoidoscopy.  It is hemostatic.  Additionally, looking from above, there is no tension on the colon or mesentery.  Sigmoidoscope was withdrawn.  Irrigation was evacuated from the pelvis.  The abdomen and pelvis are surveyed and noted to be completely hemostatic without any apparent injury.  Under direct visualization, all trochars are removed.  The Alexis wound protector was removed.  Gowns/gloves are changed and a fresh set of clean instruments utilized. Additional sterile drapes were placed around the field.   The Pfannenstiel peritoneum was closed with a running 2-0 Vicryl suture.  The rectus fascia was then closed using 2 running #1 PDS sutures.  The fascia was then palpated and noted to be completely closed.  Additional anesthetic was infiltrated at the Pfannenstiel site.  Sponge, needle,  and instrument counts were reported correct x2. 4-0 Monocryl subcuticular suture was used to close the skin of all incision sites.  Dermabond was placed over all incisions.  A honeycomb dressing placed over the Pfannenstiel as well.   She was then taken out of lithotomy, awakened from anesthesia, extubated, and transferred to a stretcher for transport to PACU in satisfactory condition having tolerated the procedure well.

## 2024-01-01 NOTE — H&P (Signed)
 CC: Here today for surgery  HPI: Charlotte Reyes is an 73 y.o. female with history of hypothyroidism, HLD, GERD, whom is seen in the office today as a referral by Dr. Kathryne Hitch for evaluation of sigmoidal stricture.  History of Present Illness She is accompanied by her sister. She was referred by Dr. Mia Creek for evaluation of a sigmoidal stricture.  She has a history of diverticulosis and a previous episode of sigmoid diverticulitis, which was complicated by two pericolic abscesses. This episode was treated successfully with antibiotics, and she did not require any surgical intervention or drainage procedures. This was her first known episode of diverticulitis.  She experiences cramping pain that signals the need to use the restroom, which alleviates the discomfort. No blood in stool, and she does not use laxatives, fiber supplements, or stool softeners. She is aware of the need to have bowel movements to prevent severe cramping.  Currently, denies any complaints. She denies any fever/chills/nausea/vomiting. She denies any abdominal pain.  CT A/P 05/2023 - Acute sigmoid diverticulitis with 2 adjacent pericolic abscesses - 2.7 x 2.1 cm and 2.3 x 1.6 cm abutting left adnexa With mild wall thickening along posterior urinary bladder attributed to secondary inflammation from adjacent pericolic abscess Small hiatal hernia  Colonoscopy 09/02/23 (Dr. Mia Creek at [facility]) - Procedure aborted due to multiple diverticula in the colon and restricted mobility - Stricture in the sigmoid - Diverticulosis in the sigmoid - Internal hemorrhoids - Exam otherwise normal  CT Virtual colonoscopy 11/10/23 - Fixed 3 cm annular constricting lesion in the mid sigmoid colon. Appearance is concerning for cancer. Recommend correlation with previous colonoscopy and tissue sampling. Scattered diverticulosis, most pronounced in the sigmoid colon.  Flexible sigmoidoscopy 11/21/23 (Dr. Mia Creek at [facility]) -Perianal and  digital rectal exam normal - Severe stenosis in sigmoid was not traversed. Area was tattooed with [place] ink. There is no abnormal mucosa in the area of stenosis, just a number diverticulum. Stenosis was 25 - 30 cm in the anal verge. - Multiple diverticula - Internal hemorrhoids  She denies any changes in health or health history since we met in the office. No new medications/allergies. She states she is ready for surgery today.  PMH: hypothyroidism, HLD, GERD  PSH: Hysterectomy performed via a Pfannenstiel incision; breast cancer surgery in 1998. She denies any other abdominal surgeries.   FHx: Denies any personal history bleeding disorders, despite a family history of hemophilia in her brother. Father had colon cancer and apparently colostomy; she denies any other known family history of colorectal, breast, endometrial or ovarian cancer  Social Hx: Denies use of tobacco/EtOH/illicit drug. She is here today with her sister.  Review of Systems: A complete review of systems was obtained from the patient. I have reviewed this information and discussed as appropriate with the patient. See HPI as well for other ROS.    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    Hypothyroidism    Sebaceous cyst    on back   Thyroid disease     Past Surgical History:  Procedure Laterality Date   COLONOSCOPY WITH PROPOFOL N/A 09/02/2017   Procedure: COLONOSCOPY WITH PROPOFOL;  Surgeon: Christena Deem, MD;  Location: [facility] ENDOSCOPY;  Service: Endoscopy;  Laterality: N/A;   COLONOSCOPY WITH PROPOFOL  N/A 09/02/2023   Procedure: COLONOSCOPY WITH PROPOFOL;  Surgeon: Regis Bill, MD;  Location: [facility] ENDOSCOPY;  Service: Endoscopy;  Laterality: N/A;   FLEXIBLE SIGMOIDOSCOPY N/A 11/21/2023   Procedure: FLEXIBLE  SIGMOIDOSCOPY;  Surgeon: Regis Bill, MD;  Location: [facility] ENDOSCOPY;  Service: Endoscopy;  Laterality: N/A;   INCISE AND DRAIN ABCESS     sebaceous cyst on back   MASTECTOMY  1998   left   SUBMUCOSAL TATTOO INJECTION  11/21/2023   Procedure: SUBMUCOSAL TATTOO INJECTION;  Surgeon: Regis Bill, MD;  Location: [facility] ENDOSCOPY;  Service: Endoscopy;;   TOTAL ABDOMINAL HYSTERECTOMY  1994    Family History  Problem Relation Age of Onset   Cancer Mother        ovarian   Cancer Father        colon   Hemophilia Brother    Clotting disorder Brother    Breast cancer Paternal Aunt 83    Social:  reports that she has never smoked. She has never used smokeless tobacco. She reports current alcohol use. She reports that she does not use drugs.  Allergies:  Allergies  Allergen Reactions   Amoxicillin-Pot Clavulanate Rash    Rash on abdomen and trunk    Medications: I have reviewed the patient's current medications.  No results found for this or any previous visit (from the past 48 hours).  No results found.   PE Blood pressure 122/67, pulse 80, temperature 98 F (36.7 C), temperature source Oral, resp. rate 18, height 5' 1.5" (1.562 m), weight 71.2 kg, SpO2 98%. Constitutional: NAD; conversant Eyes: Moist conjunctiva; no lid lag; anicteric Lungs: Normal respiratory effort CV: RRR GI: Abd soft, NT/ND; no palpable hepatosplenomegaly Psychiatric: Appropriate affect  No results found for this or any previous visit (from the past 48 hours).  No results found.  A/P: Charlotte Reyes is an 73 y.o. female with hx of hypothyroidism, HLD, GERD here for evaluation of presumed diverticular stricture of the sigmoid colon - tattoo'd; history of diverticulitis and small pericolonic abscesses managed with antibiotics previously  Unable to traverse with colonoscopy or flexible sigmoidoscopy.  Virtual colonoscopy did not show any other areas of concern aside from this location of  her sigmoidal stricture  -The anatomy and physiology of the GI tract was reviewed with the patient. The pathophysiology of diverticulitis and sigmoidal strictures was discussed as well with associated pictures.  -We have discussed various different treatment options going forward including further observation versus surgery (the most definitive) to address this -robotic assisted low anterior resection; flexible sigmoidoscopy; ureteral ICG/firefly- [facility]  -The planned procedure, material risks (including, but not limited to, pain, bleeding, infection, scarring, need for blood transfusion, damage to surrounding structures- blood vessels/nerves/viscus/organs, damage to ureter, urine leak, leak from anastomosis, need for additional procedures, low anterior resection syndrome (LARS) = increased fecal urgency and/or frequency, scenarios where a stoma may be necessary and where it may be permanent, worsening of pre-existing medical conditions, chronic diarrhea, constipation secondary to narcotic use, hernia, recurrence, pneumonia, heart attack, stroke, death) benefits and alternatives to surgery were discussed at length. The patient's questions were answered to her and her sister's satisfaction, they voiced understanding and elected to proceed with surgery. Additionally, we discussed typical postoperative expectations and the recovery process.  She notes that she feels most comfortable proceeding with surgery as there is still some uncertainty with regards to whether or not there could be neoplasia present at the site of her stricture. We did discuss that  this is most likely related to prior diverticulitis but certainly anything we remove at the time of surgery will be submitted to pathology for further assessment.   Marin Olp, MD [facility], [facility]

## 2024-01-01 NOTE — Op Note (Signed)
 Operative Note  Preoperative diagnosis:  1.  Sigmoid stricture  Postoperative diagnosis: 1.  Sigmoid stricture  Procedure(s): 1.  Cystoscopy 2. Firefly instillation in bilateral ureters 3.  Bilateral retrograde pyelogram   Surgeon: Vilma Prader MD  Assistants:  None  Anesthesia:  General  Complications:  None  EBL:  None for my portion of the case  Specimens: 1. None for my portion of the case  Drains/Catheters: 1.  16 fr catheter   Intraoperative findings:   Bladder without suspicious bladder lesions.  Retrograde pyelogram interpretation: Bilateral retrograde pyelograms demonstrating that firefly reach the renal collecting system and fill the entirety of the ureter.  No filling defects noted.   Indication: Sigmoid stricture  Description of procedure: The patient was identified and surgical site verification was performed prior to obtaining consent.  The patient was brought to the operating suite.  Under adequate general anesthesia the patient was positioned in dorsal lithotomy and prepped and draped.  A preoperative Time Out was performed addressing the anticipated surgical site, procedure, and safety precautions.  The 21Fr cystoscope was inserted into the patient's urethral meatus and advanced to the bladder.   The bladder was inspected with the 30 degree lens.  The ureteral orifices were in their normal anatomic positions.  The bladder showed no trabeculation.  There were no bladder tumors noted.  The right orifice was intubated with a sensor wire and a 5 [place] open ended catheter was placed over the wire into the right ureter and advanced to 25 cm.  The firefly was mixed with the contrast I then slowly pulled back and injected  15 ml of the firefly contrast.  Retrograde pyelogram was taken demonstrating that the firefly had reached the renal pelvis.  I subsequently turned my attention to the patient's left ureteral orifice and performed a similar task, injecting  15ml of the firefly solution. in a retrograde fashion in the left ureter.   I then  placed a 14 [place] Foley.  The case was then turned over to Dr. Cliffton Asters team for the remainder of the procedure.  Sherle Poe, MD [facility]  Pager: [phone]

## 2024-01-01 NOTE — Transfer of Care (Signed)
 Immediate Anesthesia Transfer of Care Note  Patient: Charlotte Reyes  Procedure(s) Performed: RESECTION, RECTUM, LOW ANTERIOR, ROBOT-ASSISTED FLEXIBLE SIGMOIDOSCOPY, URETERAL ICG CYSTOSCOPY with FIREFLY INJECTION  Patient Location: PACU  Anesthesia Type:General  Level of Consciousness: drowsy  Airway & Oxygen Therapy: Patient Spontanous Breathing  Post-op Assessment: Report given to RN  Post vital signs: Reviewed and stable  Last Vitals:  Vitals Value Taken Time  BP 123/62 01/01/24 1015  Temp    Pulse 82 01/01/24 1018  Resp 13 01/01/24 1018  SpO2 99 % 01/01/24 1018  Vitals shown include unfiled device data.  Last Pain:  Vitals:   01/01/24 0609  TempSrc: Oral  PainSc:          Complications: No notable events documented.

## 2024-01-01 NOTE — Anesthesia Postprocedure Evaluation (Signed)
 Anesthesia Post Note  Patient: Charlotte Reyes  Procedure(s) Performed: RESECTION, RECTUM, LOW ANTERIOR, ROBOT-ASSISTED FLEXIBLE SIGMOIDOSCOPY, URETERAL ICG CYSTOSCOPY with FIREFLY INJECTION     Patient location during evaluation: PACU Anesthesia Type: General Level of consciousness: awake and alert Pain management: pain level controlled Vital Signs Assessment: post-procedure vital signs reviewed and stable Respiratory status: spontaneous breathing, nonlabored ventilation, respiratory function stable and patient connected to nasal cannula oxygen Cardiovascular status: blood pressure returned to baseline and stable Postop Assessment: no apparent nausea or vomiting Anesthetic complications: no   No notable events documented.  Last Vitals:  Vitals:   01/01/24 1733 01/01/24 1838  BP: (!) 100/48 125/74  Pulse: 74 86  Resp: 16 16  Temp: 37 C 36.5 C  SpO2: 100% 98%    Last Pain:  Vitals:   01/01/24 1838  TempSrc: Oral  PainSc:                  Nelle Don Jkwon Treptow

## 2024-01-01 NOTE — Anesthesia Procedure Notes (Signed)
 Procedure Name: Intubation Date/Time: 01/01/2024 7:39 AM  Performed by: Randa Evens, CRNAPre-anesthesia Checklist: Patient identified, Emergency Drugs available, Suction available and Patient being monitored Patient Re-evaluated:Patient Re-evaluated prior to induction Oxygen Delivery Method: Circle System Utilized Preoxygenation: Pre-oxygenation with 100% oxygen Induction Type: IV induction Ventilation: Mask ventilation without difficulty Laryngoscope Size: Mac and 3 Grade View: Grade II Tube type: Oral Tube size: 7.0 mm Number of attempts: 1 Airway Equipment and Method: Stylet and Oral airway Placement Confirmation: ETT inserted through vocal cords under direct vision, positive ETCO2 and breath sounds checked- equal and bilateral Secured at: 22 cm Tube secured with: Tape Dental Injury: Teeth and Oropharynx as per pre-operative assessment

## 2024-01-02 ENCOUNTER — Encounter (HOSPITAL_COMMUNITY): Payer: Self-pay | Admitting: Surgery

## 2024-01-02 LAB — CBC
HCT: 37.3 % (ref 36.0–46.0)
Hemoglobin: 12 g/dL (ref 12.0–15.0)
MCH: 30 pg (ref 26.0–34.0)
MCHC: 32.2 g/dL (ref 30.0–36.0)
MCV: 93.3 fL (ref 80.0–100.0)
Platelets: 238 10*3/uL (ref 150–400)
RBC: 4 MIL/uL (ref 3.87–5.11)
RDW: 12.8 % (ref 11.5–15.5)
WBC: 10.4 10*3/uL (ref 4.0–10.5)
nRBC: 0 % (ref 0.0–0.2)

## 2024-01-02 LAB — BASIC METABOLIC PANEL
Anion gap: 8 (ref 5–15)
BUN: 8 mg/dL (ref 8–23)
CO2: 26 mmol/L (ref 22–32)
Calcium: 8 mg/dL — ABNORMAL LOW (ref 8.9–10.3)
Chloride: 100 mmol/L (ref 98–111)
Creatinine, Ser: 0.63 mg/dL (ref 0.44–1.00)
GFR, Estimated: >60 mL/min (ref >60)
Glucose, Bld: 122 mg/dL — ABNORMAL HIGH (ref 70–99)
Potassium: 3.6 mmol/L (ref 3.5–5.1)
Sodium: 134 mmol/L — ABNORMAL LOW (ref 135–145)

## 2024-01-02 MED ORDER — TRAMADOL HCL 50 MG PO TABS: 50.0000 mg | ORAL_TABLET | Freq: Four times a day (QID) | ORAL | 0 refills | Status: AC | PRN

## 2024-01-02 NOTE — Progress Notes (Signed)
   01/02/24 1003  TOC Brief Assessment  Insurance and Status Reviewed  Patient has primary care physician Yes  Home environment has been reviewed resides in private residence  Prior level of function: Independent  Prior/Current Home Services No current home services  Social Drivers of Health Review SDOH reviewed no interventions necessary  Readmission risk has been reviewed Yes  Transition of care needs no transition of care needs at this time

## 2024-01-02 NOTE — Plan of Care (Signed)
  Problem: Education: Goal: Understanding of discharge needs will improve Outcome: Progressing   Problem: Activity: Goal: Ability to tolerate increased activity will improve Outcome: Progressing   Problem: Bowel/Gastric: Goal: Gastrointestinal status for postoperative course will improve Outcome: Progressing   Problem: Nutritional: Goal: Will attain and maintain optimal nutritional status will improve Outcome: Progressing

## 2024-01-02 NOTE — Progress Notes (Signed)
 Mobility Specialist - Progress Note   01/02/24 0947  Mobility  Activity Ambulated independently in hallway  Level of Assistance Independent  Assistive Device None  Distance Ambulated (ft) 500 ft  Activity Response Tolerated well  Mobility Referral Yes  Mobility visit 1 Mobility  Mobility Specialist Start Time (ACUTE ONLY) 0940  Mobility Specialist Stop Time (ACUTE ONLY) 0946  Mobility Specialist Time Calculation (min) (ACUTE ONLY) 6 min   Pt received in bed and agreeable to mobility. No complaints during session. Pt to bathroom after session with all needs met.    [facility]

## 2024-01-02 NOTE — Discharge Instructions (Signed)

## 2024-01-02 NOTE — Progress Notes (Signed)
  Subjective No acute events. She is doing great. She has no complaints. Tolerating liquids without n/v. Had BM overnight. Pain well controlled  Objective: Vital signs in last 24 hours: Temp:  [97.4 F (36.3 C)-98.6 F (37 C)] 98.5 F (36.9 C) (03/21 0513) Pulse Rate:  [62-88] 84 (03/21 0513) Resp:  [13-26] 18 (03/21 0513) BP: (100-139)/(48-74) 132/58 (03/21 0513) SpO2:  [91 %-100 %] 100 % (03/21 0513) Weight:  [72.3 kg] 72.3 kg (03/21 0500) Last BM Date : 12/31/23  Intake/Output from previous day: 03/20 0701 - 03/21 0700 In: 2794.6 [P.O.:840; I.V.:1854.6; IV Piggyback:100] Out: 1740 [Urine:1475; Blood:15] Intake/Output this shift: No intake/output data recorded.  Gen: NAD, comfortable CV: RRR Pulm: Normal work of breathing Abd: Soft, NT/ND. Incisions c/d/i Ext: SCDs in place  Lab Results: CBC  Recent Labs    01/02/24 0438  WBC 10.4  HGB 12.0  HCT 37.3  PLT 238   BMET Recent Labs    01/02/24 0438  NA 134*  K 3.6  CL 100  CO2 26  GLUCOSE 122*  BUN 8  CREATININE 0.63  CALCIUM 8.0*   PT/INR No results for input(s): "LABPROT", "INR" in the last 72 hours. ABG No results for input(s): "PHART", "HCO3" in the last 72 hours.  Invalid input(s): "PCO2", "PO2"  Studies/Results:  Anti-infectives: Anti-infectives (From admission, onward)    Start     Dose/Rate Route Frequency Ordered Stop   01/01/24 0600  ertapenem (INVANZ) 1 g in sodium chloride 0.9 % 100 mL IVPB        1 g 200 mL/hr over 30 Minutes Intravenous On call to O.R. 01/01/24 0541 01/01/24 0824        Assessment/Plan: Patient Active Problem List   Diagnosis Date Noted   S/P laparoscopic-assisted sigmoidectomy 01/01/2024   Mixed hyperlipidemia 11/13/2023   History of cancer of left breast 07/23/2023   Insomnia 07/23/2023   History of diverticulitis 07/23/2023   Allergic rhinitis 07/18/2023   Asymptomatic hemophilia A carrier 07/18/2023   Autoimmune lymphocytic chronic thyroiditis  07/18/2023   Avitaminosis D 07/18/2023   Family history of breast cancer in female 07/18/2023   OP (osteoporosis) 07/18/2023   History of mastectomy, left 07/18/2023   Esophageal reflux 05/05/2014   s/p Procedure(s): RESECTION, RECTUM, LOW ANTERIOR, ROBOT-ASSISTED FLEXIBLE SIGMOIDOSCOPY, URETERAL ICG CYSTOSCOPY with FIREFLY INJECTION 01/01/2024  - Doing great - Advance to soft diet - Ambulate 5x/day - D/C Foley - D/C Entereg - SLIV - Dispo: Possible discharge home 3/22 if continues to do well  - We spent time reviewing her procedure, findings, plans and expectations   LOS: 1 day   Marin Olp, MD [facility]

## 2024-01-03 LAB — CBC
HCT: 34.6 % — ABNORMAL LOW (ref 36.0–46.0)
Hemoglobin: 10.9 g/dL — ABNORMAL LOW (ref 12.0–15.0)
MCH: 29.9 pg (ref 26.0–34.0)
MCHC: 31.5 g/dL (ref 30.0–36.0)
MCV: 95.1 fL (ref 80.0–100.0)
Platelets: 224 10*3/uL (ref 150–400)
RBC: 3.64 MIL/uL — ABNORMAL LOW (ref 3.87–5.11)
RDW: 13.1 % (ref 11.5–15.5)
WBC: 7.6 10*3/uL (ref 4.0–10.5)
nRBC: 0 % (ref 0.0–0.2)

## 2024-01-03 LAB — BASIC METABOLIC PANEL
Anion gap: 6 (ref 5–15)
BUN: 11 mg/dL (ref 8–23)
CO2: 28 mmol/L (ref 22–32)
Calcium: 8.2 mg/dL — ABNORMAL LOW (ref 8.9–10.3)
Chloride: 104 mmol/L (ref 98–111)
Creatinine, Ser: 0.81 mg/dL (ref 0.44–1.00)
GFR, Estimated: >60 mL/min (ref >60)
Glucose, Bld: 98 mg/dL (ref 70–99)
Potassium: 4 mmol/L (ref 3.5–5.1)
Sodium: 138 mmol/L (ref 135–145)

## 2024-01-03 NOTE — Progress Notes (Signed)
 AVS reviewed  w/ pt and daughter - both verbalized an understanding- no other questions at this time.PIV removed by primary nurse as noted. Pt dressed for d/c - to lobby via w/c

## 2024-01-03 NOTE — Plan of Care (Signed)

## 2024-01-03 NOTE — Discharge Summary (Signed)
 Physician Discharge Summary  Patient ID: Zenia Guest MRN: [id number] DOB/AGE: [birth year] 74 y.o.  Admit date: 01/01/2024 Discharge date: 01/03/2024  Admission Diagnoses:  Discharge Diagnoses:  Principal Problem:   S/P laparoscopic-assisted sigmoidectomy   Discharged Condition: good  Hospital Course: uneventful post op recovery.  Quick return of bowel function. Pain controlled. Ambulating well Discharged home POD# 2  Consults: None  Significant Diagnostic Studies:   Treatments: surgery: robotic LAR  Discharge Exam: Blood pressure (!) 110/52, pulse 75, temperature 98.7 F (37.1 C), temperature source Oral, resp. rate 18, height 5' 1.5" (1.562 m), weight 72.3 kg, SpO2 97%. General appearance: alert, cooperative, and no distress Resp: clear to auscultation bilaterally Cardio: regular rate and rhythm, S1, S2 normal, no murmur, click, rub or gallop Incision/Wound:abdomen soft, dressing dry  Disposition: Discharge disposition: 01-Home or Self Care        Allergies as of 01/03/2024       Reactions   Amoxicillin-pot Clavulanate Rash   Rash on abdomen and trunk        Medication List     TAKE these medications    fexofenadine 180 MG tablet Commonly known as: ALLEGRA Take 180 mg by mouth daily as needed for allergies.   levothyroxine 100 MCG tablet Commonly known as: SYNTHROID Take 100 mcg by mouth daily before breakfast.   Magnesium 400 MG Tabs Take 400 mg by mouth daily.   pantoprazole 40 MG tablet Commonly known as: PROTONIX Take 1 tablet by mouth daily.   POTASSIUM PO Take 1 tablet by mouth daily.   rosuvastatin 10 MG tablet Commonly known as: Crestor Take 1 tablet (10 mg total) by mouth daily.   traMADol 50 MG tablet Commonly known as: Ultram Take 1 tablet (50 mg total) by mouth every 6 (six) hours as needed for up to 5 days (postop pain not controlled with tylenol and ibuprofen first).   TURMERIC PO Take 2,000 mg by mouth daily at 2  PM.   Vitamin D3 50 MCG (2000 UT) capsule Take 2,000 Units by mouth daily.   zolpidem 5 MG tablet Commonly known as: Ambien Take 1 tablet (5 mg total) by mouth at bedtime as needed for sleep.        Follow-up Information     Andria Meuse, MD Follow up on 01/20/2024.   Specialties: General Surgery, Colon and Rectal Surgery Why: Please arrive by 10:00 am Contact information: [address] [place] [address] [phone]                 Signed: Abigail Miyamoto 01/03/2024, 8:33 AM

## 2024-01-03 NOTE — Progress Notes (Signed)
 Patient ID: Charlotte Reyes, female   DOB: 11-24-1950, 73 y.o.   MRN: [id number]   Doing well Plan discharge home this morning

## 2024-01-05 LAB — SURGICAL PATHOLOGY

## 2024-02-16 ENCOUNTER — Encounter: Payer: Self-pay | Admitting: Nurse Practitioner

## 2024-02-16 DIAGNOSIS — Z9012 Acquired absence of left breast and nipple: Secondary | ICD-10-CM

## 2024-02-19 ENCOUNTER — Telehealth: Payer: Self-pay | Admitting: Nurse Practitioner

## 2024-02-19 DIAGNOSIS — H3540 Unspecified peripheral retinal degeneration: Secondary | ICD-10-CM | POA: Diagnosis not present

## 2024-02-19 DIAGNOSIS — H524 Presbyopia: Secondary | ICD-10-CM | POA: Diagnosis not present

## 2024-02-19 DIAGNOSIS — H2513 Age-related nuclear cataract, bilateral: Secondary | ICD-10-CM | POA: Diagnosis not present

## 2024-02-19 DIAGNOSIS — H25013 Cortical age-related cataract, bilateral: Secondary | ICD-10-CM | POA: Diagnosis not present

## 2024-02-19 NOTE — Telephone Encounter (Signed)
 Order faxed to [facility] on Tuesday. Called and confirmed with them that this was received. Per [facility], they have faxed the paperwork back to us  to sign and then fax back to them.   Will await paperwork and send back for the patient.

## 2024-02-19 NOTE — Telephone Encounter (Signed)
 Copied from CRM [id number]. Topic: General - Other >> Feb 18, 2024  4:57 PM Rachelle R wrote: Reason for CRM: Patient calling for update on her request for prescription for 1 insert and 6 bras from [facility]. Is going to calls [facility] to get a fax number to see if the prescription can be faxed right to them. >> Feb 18, 2024  5:03 PM Felizardo Hotter wrote: Pt called to provide fax# [phone] for [facility].

## 2024-02-20 NOTE — Telephone Encounter (Signed)
 Order faxed to [facility] as requested.

## 2024-02-23 NOTE — Telephone Encounter (Signed)
 Paperwork received from [facility]. Given to provider to sign. Will fax back to them once signature is done.

## 2024-02-23 NOTE — Telephone Encounter (Signed)
 Forms have been signed and faxed back to [facility].

## 2024-03-10 DIAGNOSIS — H25813 Combined forms of age-related cataract, bilateral: Secondary | ICD-10-CM | POA: Diagnosis not present

## 2024-03-16 DIAGNOSIS — C50112 Malignant neoplasm of central portion of left female breast: Secondary | ICD-10-CM | POA: Diagnosis not present

## 2024-03-16 DIAGNOSIS — Z4432 Encounter for fitting and adjustment of external left breast prosthesis: Secondary | ICD-10-CM | POA: Diagnosis not present

## 2024-03-16 DIAGNOSIS — Z9012 Acquired absence of left breast and nipple: Secondary | ICD-10-CM | POA: Diagnosis not present

## 2024-03-19 DIAGNOSIS — E039 Hypothyroidism, unspecified: Secondary | ICD-10-CM | POA: Diagnosis not present

## 2024-03-19 DIAGNOSIS — H25811 Combined forms of age-related cataract, right eye: Secondary | ICD-10-CM | POA: Diagnosis not present

## 2024-04-02 DIAGNOSIS — E039 Hypothyroidism, unspecified: Secondary | ICD-10-CM | POA: Diagnosis not present

## 2024-04-02 DIAGNOSIS — H25812 Combined forms of age-related cataract, left eye: Secondary | ICD-10-CM | POA: Diagnosis not present

## 2024-05-03 ENCOUNTER — Telehealth: Payer: Self-pay

## 2024-05-03 NOTE — Telephone Encounter (Signed)
 Copied from CRM [id number]. Topic: Clinical - Prescription Issue >> May 03, 2024 11:25 AM Antonio DEL wrote: Reason for CRM: Patient called in for refill for prescription levothyroxine  (SYNTHROID ) 100 MCG tablet. No longer sees the provider who prescribed the medication for her and patient was wanting PCP to call in prescription at her pharmacy. Was advised by CAL patient needed an appointment. Scheduled appointment but soonest available appointment was not until September and patient stated she would be out before then. Please advise patient of what to do in the meantime. Call back number is [phone]

## 2024-05-04 NOTE — Telephone Encounter (Signed)
 Doesn't matter- OK to double book me tomorrow at 9:40

## 2024-05-04 NOTE — Telephone Encounter (Signed)
 Needs TSH before we can fill- last was in November. I can see her for a virtual visit and labs and we'll get her the refill

## 2024-05-05 NOTE — Telephone Encounter (Signed)
 I can see her as a Radio broadcast assistant at [employer]

## 2024-05-05 NOTE — Telephone Encounter (Signed)
 Called patient and left a message for her to call back to get scheduled.  Dr Vicci will see her tomorrow at 7/24 at 840 (Virtually)

## 2024-05-07 ENCOUNTER — Telehealth: Payer: Self-pay

## 2024-05-07 NOTE — Telephone Encounter (Signed)
 Will leave for FYI for Jolene.

## 2024-05-07 NOTE — Telephone Encounter (Signed)
 Never was able to reach patient to get her scheduled for labs but it looks like she is scheduled for an OV on 9/2

## 2024-05-07 NOTE — Telephone Encounter (Signed)
 Copied from CRM #[id number]. Topic: General - Other >> May 07, 2024 11:55 AM Treva T wrote: Reason for CRM: Received call from patient, states she is returning call to office from message that was left.  Called and spoke to office, states medical assistant will call patient back to set up virtual appointment with provider.  Patient informed, verbalized understanding.  Patient can be reached at [phone].

## 2024-05-08 ENCOUNTER — Other Ambulatory Visit: Payer: Self-pay | Admitting: Nurse Practitioner

## 2024-05-08 MED ORDER — LEVOTHYROXINE SODIUM 100 MCG PO TABS: 100.0000 ug | ORAL_TABLET | Freq: Every day | ORAL | 0 refills | Status: DC

## 2024-06-13 DIAGNOSIS — E039 Hypothyroidism, unspecified: Secondary | ICD-10-CM | POA: Insufficient documentation

## 2024-06-13 DIAGNOSIS — Z9049 Acquired absence of other specified parts of digestive tract: Secondary | ICD-10-CM | POA: Insufficient documentation

## 2024-06-13 NOTE — Patient Instructions (Signed)
 Be Involved in Caring For Your Health:  Taking Medications When medications are taken as directed, they can greatly improve your health. But if they are not taken as prescribed, they may not work. In some cases, not taking them correctly can be harmful. To help ensure your treatment remains effective and safe, understand your medications and how to take them. Bring your medications to each visit for review by your provider.  Your lab results, notes, and after visit summary will be available on My Chart. We strongly encourage you to use this feature. If lab results are abnormal the clinic will contact you with the appropriate steps. If the clinic does not contact you assume the results are satisfactory. You can always view your results on My Chart. If you have questions regarding your health or results, please contact the clinic during office hours. You can also ask questions on My Chart.  We at [facility] are grateful that you chose [place] to provide your care. We strive to provide evidence-based and compassionate care and are always looking for feedback. If you get a survey from the clinic please complete this so we can hear your opinions.  Hypothyroidism  Hypothyroidism is when the thyroid gland does not make enough of certain hormones. This is called an underactive thyroid. The thyroid gland is a small gland located in the lower front part of the neck, just in front of the windpipe (trachea). This gland makes hormones that help control how the body uses food for energy (metabolism) as well as how the heart and brain function. These hormones also play a role in keeping your bones strong. When the thyroid is underactive, it produces too little of the hormones thyroxine (T4) and triiodothyronine (T3). What are the causes? This condition may be caused by: Hashimoto's disease. This is a disease in which the body's disease-fighting system (immune system) attacks the thyroid gland. This is the  most common cause. Viral infections. Pregnancy. Certain medicines. Birth defects. Problems with a gland in the center of the brain (pituitary gland). Lack of enough iodine in the diet. Other causes may include: Past radiation treatments to the head or neck for cancer. Past treatment with radioactive iodine. Past exposure to radiation in the environment. Past surgical removal of part or all of the thyroid. What increases the risk? You are more likely to develop this condition if: You are female. You have a family history of thyroid conditions. You use a medicine called lithium. You take medicines that affect the immune system (immunosuppressants). What are the signs or symptoms? Common symptoms of this condition include: Not being able to tolerate cold. Feeling as though you have no energy (lethargy). Lack of appetite. Constipation. Sadness or depression. Weight gain that is not explained by a change in diet or exercise habits. Menstrual irregularity. Dry skin, coarse hair, or brittle nails. Other symptoms may include: Muscle pain. Slowing of thought processes. Poor memory. How is this diagnosed? This condition may be diagnosed based on: Your symptoms, your medical history, and a physical exam. Blood tests. You may also have imaging tests, such as an ultrasound or MRI. How is this treated? This condition is treated with medicine that replaces the thyroid hormones that your body does not make. After you begin treatment, it may take several weeks for symptoms to go away. Follow these instructions at home: Take over-the-counter and prescription medicines only as told by your health care provider. If you start taking any new medicines, tell your health care  provider. Keep all follow-up visits as told by your health care provider. This is important. As your condition improves, your dosage of thyroid hormone medicine may change. You will need to have blood tests regularly so that  your health care provider can monitor your condition. Contact a health care provider if: Your symptoms do not get better with treatment. You are taking thyroid hormone replacement medicine and you: Sweat a lot. Have tremors. Feel anxious. Lose weight rapidly. Cannot tolerate heat. Have emotional swings. Have diarrhea. Feel weak. Get help right away if: You have chest pain. You have an irregular heartbeat. You have a rapid heartbeat. You have difficulty breathing. These symptoms may be an emergency. Get help right away. Call 911. Do not wait to see if the symptoms will go away. Do not drive yourself to the hospital. Summary Hypothyroidism is when the thyroid gland does not make enough of certain hormones (it is underactive). When the thyroid is underactive, it produces too little of the hormones thyroxine (T4) and triiodothyronine (T3). The most common cause is Hashimoto's disease, a disease in which the body's disease-fighting system (immune system) attacks the thyroid gland. The condition can also be caused by viral infections, medicine, pregnancy, or past radiation treatment to the head or neck. Symptoms may include weight gain, dry skin, constipation, feeling as though you do not have energy, and not being able to tolerate cold. This condition is treated with medicine to replace the thyroid hormones that your body does not make. This information is not intended to replace advice given to you by your health care provider. Make sure you discuss any questions you have with your health care provider. Document Revised: 10/02/2021 Document Reviewed: 10/02/2021 Elsevier Patient Education  2024 [employer].

## 2024-06-15 ENCOUNTER — Encounter: Payer: Self-pay | Admitting: Nurse Practitioner

## 2024-06-15 ENCOUNTER — Ambulatory Visit (INDEPENDENT_AMBULATORY_CARE_PROVIDER_SITE_OTHER): Admitting: Nurse Practitioner

## 2024-06-15 VITALS — BP 122/68 | HR 87 | Temp 98.6°F | Ht 62.0 in | Wt 158.0 lb

## 2024-06-15 DIAGNOSIS — E039 Hypothyroidism, unspecified: Secondary | ICD-10-CM

## 2024-06-15 DIAGNOSIS — M8588 Other specified disorders of bone density and structure, other site: Secondary | ICD-10-CM | POA: Diagnosis not present

## 2024-06-15 DIAGNOSIS — Z23 Encounter for immunization: Secondary | ICD-10-CM

## 2024-06-15 DIAGNOSIS — E782 Mixed hyperlipidemia: Secondary | ICD-10-CM

## 2024-06-15 DIAGNOSIS — F5104 Psychophysiologic insomnia: Secondary | ICD-10-CM | POA: Diagnosis not present

## 2024-06-15 MED ORDER — ROSUVASTATIN CALCIUM 10 MG PO TABS: 10.0000 mg | ORAL_TABLET | Freq: Every day | ORAL | 3 refills | Status: AC

## 2024-06-15 MED ORDER — LEVOTHYROXINE SODIUM 100 MCG PO TABS: 100.0000 ug | ORAL_TABLET | Freq: Every day | ORAL | 3 refills | Status: DC

## 2024-06-15 MED ORDER — ZOLPIDEM TARTRATE 5 MG PO TABS: 5.0000 mg | ORAL_TABLET | Freq: Every evening | ORAL | 2 refills | Status: DC | PRN

## 2024-06-15 NOTE — Assessment & Plan Note (Signed)
 Chronic and stable low dose Ambien .  After loss of husband in March 2024 started.  Melatonin offered no benefit and Trazodone  did not work at low dose, but at next dose up made her too groggy.  Not to use Tylenol  PM due to Benadryl  and BEERS criteria. Insurance would not cover Belsomra , but would cover Ambien  and Temazepam as alternatives.  Will continue Ambien  5 MG nightly and recommend she try to take this as little as possible.  Discussed with her, she is aware of BEERS criteria on these, however insurance will not cover Belsomra  until she has tried alternatives they listed.

## 2024-06-15 NOTE — Assessment & Plan Note (Signed)
 Chronic, ongoing.  Continue current medication regimen and adjust as needed.  Labs today.

## 2024-06-15 NOTE — Assessment & Plan Note (Signed)
 Last DEXA 11/06/23 with osteopenia noted.  Took Fosamax in past for osteoporosis on review, but stopped due to jaw issues.  Maintain off this.  Continue Vitamin D  supplement at home and check level.  Repeat DEXA around 11/05/2028.

## 2024-06-15 NOTE — Assessment & Plan Note (Signed)
Chronic, ongoing.  Continue current Levothyroxine dosing and adjust as needed based on labs.  TSH and Free T4 today. 

## 2024-06-15 NOTE — Progress Notes (Signed)
 BP 122/68 (BP Location: Left Arm)   Pulse 87   Temp 98.6 F (37 C) (Oral)   Ht 5' 2 (1.575 m)   Wt 158 lb (71.7 kg)   SpO2 98%   BMI 28.90 kg/m    Subjective:    Patient ID: Charlotte Reyes, female    DOB: [birth year], 73 y.o.   MRN: [id number]  HPI: Charlotte Reyes is a 73 y.o. female  Chief Complaint  Patient presents with   Hypothyroidism   HYPERLIPIDEMIA Continues Rosuvastatin . Hyperlipidemia status: good compliance Satisfied with current treatment?  yes Side effects:  no Medication compliance: good compliance Supplements: none Aspirin:  no The 10-year ASCVD risk score (Arnett DK, et al., 2019) is: 13.5%   Values used to calculate the score:     Age: 36 years     Clincally relevant sex: Female     Is Non-Hispanic African American: No     Diabetic: No     Tobacco smoker: No     Systolic Blood Pressure: 122 mmHg     Is BP treated: No     HDL Cholesterol: 67 mg/dL     Total Cholesterol: 234 mg/dL Chest pain:  no Coronary artery disease:  no Family history CAD:  no Family history early CAD:  no   HYPOTHYROIDISM Continues Levothyroxine  100 MCG daily.  Takes Vitamin D3 for bone health with osteopenia, no recent falls or fractures. Thyroid  control status:stable Satisfied with current treatment? yes Medication side effects: no Medication compliance: good compliance Etiology of hypothyroidism: unknown Recent dose adjustment:no Fatigue: occasional Cold intolerance: no Heat intolerance: no Weight gain: no Weight loss: no Constipation: no -had resection on 01/01/24 due to diverticulitis Diarrhea/loose stools: no Palpitations: no Lower extremity edema: no Anxiety/depressed mood: no   INSOMNIA Continues on Ambien  5 MG at night for sleep. Risks, benefits, side effects and alternative therapies were discussed.  The opportunity to ask questions was provided and they were answered to the best of my ability.  The patient expressed understanding and  willingness to follow the outlined treatment protocols and was able to verbalize plan of care back to provider. She wishes to maintain Ambien  at this time.  Last fill 05/30/24. Duration: years Satisfied with sleep quality: yes Difficulty falling asleep: no Difficulty staying asleep: no Waking a few hours after sleep onset: no Early morning awakenings: no Daytime hypersomnolence: no Wakes feeling refreshed: yes Good sleep hygiene: yes Apnea: no Snoring: yes occasional Depressed/anxious mood: no Recent stress: no Restless legs/nocturnal leg cramps: no Chronic pain/arthritis: no History of sleep study: no Treatments attempted: melatonin, benadryl , and ambien        06/15/2024    2:38 PM 11/13/2023   11:21 AM 09/29/2023    8:13 AM 08/26/2023    1:32 PM 07/23/2023   11:15 AM  Depression screen PHQ 2/9  Decreased Interest 0 0 0 0 0  Down, Depressed, Hopeless 0 0 0 0 0  PHQ - 2 Score 0 0 0 0 0  Altered sleeping 1 0 1 1 1   Tired, decreased energy 1 0 1 0 1  Change in appetite 1 0 0 0 0  Feeling bad or failure about yourself  0 0 0 0 0  Trouble concentrating 0 0 0 0 0  Moving slowly or fidgety/restless 0 0 0 0 0  Suicidal thoughts 0 0 0 0 0  PHQ-9 Score 3 0 2 1 2   Difficult doing work/chores  Not difficult at all Not difficult at all  Not difficult at all Not difficult at all       06/15/2024    2:38 PM 11/13/2023   11:21 AM 09/29/2023    8:13 AM 08/26/2023    1:33 PM  GAD 7 : Generalized Anxiety Score  Nervous, Anxious, on Edge 0 0 0 0  Control/stop worrying 0 0 0 0  Worry too much - different things 0 0 0 0  Trouble relaxing 0 0 0 0  Restless 0 0 0 0  Easily annoyed or irritable 0 0 0 0  Afraid - awful might happen 0 0 0 0  Total GAD 7 Score 0 0 0 0  Anxiety Difficulty  Not difficult at all Not difficult at all Not difficult at all    Relevant past medical, surgical, family and social history reviewed and updated as indicated. Interim medical history since our last visit  reviewed. Allergies and medications reviewed and updated.  Review of Systems  Constitutional:  Negative for activity change, appetite change, diaphoresis, fatigue and fever.  Respiratory:  Negative for cough, chest tightness, shortness of breath and wheezing.   Cardiovascular:  Negative for chest pain, palpitations and leg swelling.  Gastrointestinal: Negative.   Neurological: Negative.   Psychiatric/Behavioral:  Negative for decreased concentration, self-injury, sleep disturbance and suicidal ideas. The patient is not nervous/anxious.     Per HPI unless specifically indicated above     Objective:    BP 122/68 (BP Location: Left Arm)   Pulse 87   Temp 98.6 F (37 C) (Oral)   Ht 5' 2 (1.575 m)   Wt 158 lb (71.7 kg)   SpO2 98%   BMI 28.90 kg/m   Wt Readings from Last 3 Encounters:  06/15/24 158 lb (71.7 kg)  01/02/24 159 lb 6.3 oz (72.3 kg)  12/26/23 157 lb (71.2 kg)    Physical Exam Vitals and nursing note reviewed.  Constitutional:      General: She is awake. She is not in acute distress.    Appearance: She is well-developed and well-groomed. She is not ill-appearing or toxic-appearing.  HENT:     Head: Normocephalic.     Right Ear: Hearing and external ear normal.     Left Ear: Hearing and external ear normal.  Eyes:     General: Lids are normal.        Right eye: No discharge.        Left eye: No discharge.     Conjunctiva/sclera: Conjunctivae normal.     Pupils: Pupils are equal, round, and reactive to light.  Neck:     Thyroid : No thyromegaly.     Vascular: No carotid bruit.  Cardiovascular:     Rate and Rhythm: Normal rate and regular rhythm.     Heart sounds: Normal heart sounds. No murmur heard.    No gallop.  Pulmonary:     Effort: Pulmonary effort is normal. No accessory muscle usage or respiratory distress.     Breath sounds: Normal breath sounds.  Abdominal:     General: Bowel sounds are normal. There is no distension.     Palpations: Abdomen is  soft.     Tenderness: There is no abdominal tenderness.  Musculoskeletal:     Cervical back: Normal range of motion and neck supple.     Right lower leg: No edema.     Left lower leg: No edema.  Lymphadenopathy:     Cervical: No cervical adenopathy.  Skin:    General: Skin is warm and  dry.  Neurological:     Mental Status: She is alert and oriented to person, place, and time.     Deep Tendon Reflexes: Reflexes are normal and symmetric.     Reflex Scores:      Brachioradialis reflexes are 2+ on the right side and 2+ on the left side.      Patellar reflexes are 2+ on the right side and 2+ on the left side. Psychiatric:        Attention and Perception: Attention normal.        Mood and Affect: Mood normal.        Speech: Speech normal.        Behavior: Behavior normal. Behavior is cooperative.        Thought Content: Thought content normal.     Results for orders placed or performed during the hospital encounter of 01/01/24  ABO/Rh   Collection Time: 01/01/24  5:53 AM  Result Value Ref Range   ABO/RH(D)      MALVA NEG Performed at [facility], [address] [place], [place] [address]   Surgical pathology   Collection Time: 01/01/24  9:19 AM  Result Value Ref Range   SURGICAL PATHOLOGY      SURGICAL PATHOLOGY CASE: [id number] PATIENT: [facility] Surgical Pathology Report     Clinical History: Stricture of sigmoid colon (las)     FINAL MICROSCOPIC DIAGNOSIS:  A. COLON, RECTO SIGMOID, RESECTION: - Segment of colon (13 cm) showing diverticulosis with associated serosal adhesions - Margins appear viable     GROSS DESCRIPTION:  Received fresh is a 13 cm segment of colon with the open end clinically identified as proximal.  There are adhesions present on the serosal surface.  The mucosa is glistening, tan and there are diverticula present.  In the midportion the wall is thickened and fibrotic measuring up to 0.8 cm with narrowing of the  lumen which measures less than 1 cm in diameter.  There are no perforations or abscesses grossly identified. Sections are submitted in 5 cassettes. 1 = proximal margin 2 = distal margin 3-5 = sections to include diverticula (GRP 01/02/2024)   Final Diagnosis performed by Katrine Muskrat, MD.   Electronic ally signed 01/05/2024 Technical and / or Professional components performed at [facility], [address], [place], [place] [address].  Immunohistochemistry Technical component (if applicable) was performed at [facility]. [address], [address], [place], [place] [address].   IMMUNOHISTOCHEMISTRY DISCLAIMER (if applicable): Some of these immunohistochemical stains may have been developed and the performance characteristics determine by [facility]. Some may not have been cleared or approved by the U.S. Food and Drug Administration. The FDA has determined that such clearance or approval is not necessary. This test is used for clinical purposes. It should not be regarded as investigational or for research. This laboratory is certified under the Clinical Laboratory Improvement Amendments of 1988 (CLIA-88) as qualified to perform high complexity clinical laboratory testing.  The controls stained appropriately.   IHC stains  are performed on formalin fixed, paraffin embedded tissue using a 3,3diaminobenzidine (DAB) chromogen and Leica Bond Autostainer System. The staining intensity of the nucleus is score manually and is reported as the percentage of tumor cell nuclei demonstrating specific nuclear staining. The specimens are fixed in 10% Neutral Formalin for at least 6 hours and up to 72hrs. These tests are validated on decalcified tissue. Results should be interpreted with caution given the possibility of false  negative results on decalcified specimens. Antibody Clones are as follows ER-clone 2F, PR-clone 16, Ki67- clone MM1. Some of  these immunohistochemical stains may have been developed and the performance characteristics determined by [facility].   CBC   Collection Time: 01/02/24  4:38 AM  Result Value Ref Range   WBC 10.4 4.0 - 10.5 K/uL   RBC 4.00 3.87 - 5.11 MIL/uL   Hemoglobin 12.0 12.0 - 15.0 g/dL   HCT 62.6 63.9 - 53.9 %   MCV 93.3 80.0 - 100.0 fL   MCH 30.0 26.0 - 34.0 pg   MCHC 32.2 30.0 - 36.0 g/dL   RDW 87.1 88.4 - 84.4 %   Platelets 238 150 - 400 K/uL   nRBC 0.0 0.0 - 0.2 %  Basic metabolic panel   Collection Time: 01/02/24  4:38 AM  Result Value Ref Range   Sodium 134 (L) 135 - 145 mmol/L   Potassium 3.6 3.5 - 5.1 mmol/L   Chloride 100 98 - 111 mmol/L   CO2 26 22 - 32 mmol/L   Glucose, Bld 122 (H) 70 - 99 mg/dL   BUN 8 8 - 23 mg/dL   Creatinine, Ser 9.36 0.44 - 1.00 mg/dL   Calcium  8.0 (L) 8.9 - 10.3 mg/dL   GFR, Estimated >39 >39 mL/min   Anion gap 8 5 - 15  CBC   Collection Time: 01/03/24  5:08 AM  Result Value Ref Range   WBC 7.6 4.0 - 10.5 K/uL   RBC 3.64 (L) 3.87 - 5.11 MIL/uL   Hemoglobin 10.9 (L) 12.0 - 15.0 g/dL   HCT 65.3 (L) 63.9 - 53.9 %   MCV 95.1 80.0 - 100.0 fL   MCH 29.9 26.0 - 34.0 pg   MCHC 31.5 30.0 - 36.0 g/dL   RDW 86.8 88.4 - 84.4 %   Platelets 224 150 - 400 K/uL   nRBC 0.0 0.0 - 0.2 %  Basic metabolic panel   Collection Time: 01/03/24  5:08 AM  Result Value Ref Range   Sodium 138 135 - 145 mmol/L   Potassium 4.0 3.5 - 5.1 mmol/L   Chloride 104 98 - 111 mmol/L   CO2 28 22 - 32 mmol/L   Glucose, Bld 98 70 - 99 mg/dL   BUN 11 8 - 23 mg/dL   Creatinine, Ser 9.18 0.44 - 1.00 mg/dL   Calcium  8.2 (L) 8.9 - 10.3 mg/dL   GFR, Estimated >39 >39 mL/min   Anion gap 6 5 - 15      Assessment & Plan:   Problem List Items Addressed This Visit       Endocrine   Hypothyroid   Chronic, ongoing.  Continue current Levothyroxine  dosing and adjust as needed based on labs.  TSH and Free T4 today.      Relevant Medications   levothyroxine  (SYNTHROID ) 100  MCG tablet   Other Relevant Orders   T4, free   CBC with Differential/Platelet   TSH     Musculoskeletal and Integument   Osteopenia of lumbar spine   Last DEXA 11/06/23 with osteopenia noted.  Took Fosamax in past for osteoporosis on review, but stopped due to jaw issues.  Maintain off this.  Continue Vitamin D  supplement at home and check level.  Repeat DEXA around 11/05/2028.      Relevant Orders   VITAMIN D  25 Hydroxy (Vit-D Deficiency, Fractures)     Other   Mixed hyperlipidemia - Primary   Chronic, ongoing.  Continue current medication regimen  and adjust as needed. Labs today.      Relevant Medications   rosuvastatin  (CRESTOR ) 10 MG tablet   Other Relevant Orders   Comprehensive metabolic panel with GFR   Lipid Panel w/o Chol/HDL Ratio   Insomnia   Chronic and stable low dose Ambien .  After loss of husband in March 2024 started.  Melatonin offered no benefit and Trazodone  did not work at low dose, but at next dose up made her too groggy.  Not to use Tylenol  PM due to Benadryl  and BEERS criteria. Insurance would not cover Belsomra , but would cover Ambien  and Temazepam as alternatives.  Will continue Ambien  5 MG nightly and recommend she try to take this as little as possible.  Discussed with her, she is aware of BEERS criteria on these, however insurance will not cover Belsomra  until she has tried alternatives they listed.        Other Visit Diagnoses       Flu vaccine need       High dose flu vaccine today in office, educated patient   Relevant Orders   Flu Vaccine Trivalent High Dose (Fluad)        Follow up plan: Return in about 6 months (around 12/13/2024) for Hypothyroid, HLD, INSOMNIA, OSTEOPENIA.

## 2024-06-16 ENCOUNTER — Ambulatory Visit: Payer: Self-pay | Admitting: Nurse Practitioner

## 2024-06-16 DIAGNOSIS — E039 Hypothyroidism, unspecified: Secondary | ICD-10-CM

## 2024-06-16 LAB — COMPREHENSIVE METABOLIC PANEL WITH GFR
ALT: 16 IU/L (ref 0–32)
AST: 15 IU/L (ref 0–40)
Albumin: 4.4 g/dL (ref 3.8–4.8)
Alkaline Phosphatase: 84 IU/L (ref 44–121)
BUN/Creatinine Ratio: 16 (ref 12–28)
BUN: 16 mg/dL (ref 8–27)
Bilirubin Total: <0.2 mg/dL (ref 0.0–1.2)
CO2: 23 mmol/L (ref 20–29)
Calcium: 9.3 mg/dL (ref 8.7–10.3)
Chloride: 101 mmol/L (ref 96–106)
Creatinine, Ser: 1 mg/dL (ref 0.57–1.00)
Globulin, Total: 2 g/dL (ref 1.5–4.5)
Glucose: 97 mg/dL (ref 70–99)
Potassium: 4 mmol/L (ref 3.5–5.2)
Sodium: 140 mmol/L (ref 134–144)
Total Protein: 6.4 g/dL (ref 6.0–8.5)
eGFR: 59 mL/min/1.73 — ABNORMAL LOW (ref >59)

## 2024-06-16 LAB — LIPID PANEL W/O CHOL/HDL RATIO
Cholesterol, Total: 153 mg/dL (ref 100–199)
HDL: 60 mg/dL (ref >39)
LDL Chol Calc (NIH): 65 mg/dL (ref 0–99)
Triglycerides: 166 mg/dL — ABNORMAL HIGH (ref 0–149)
VLDL Cholesterol Cal: 28 mg/dL (ref 5–40)

## 2024-06-16 LAB — CBC WITH DIFFERENTIAL/PLATELET
Basophils Absolute: 0 x10E3/uL (ref 0.0–0.2)
Basos: 1 %
EOS (ABSOLUTE): 0.2 x10E3/uL (ref 0.0–0.4)
Eos: 3 %
Hematocrit: 37.3 % (ref 34.0–46.6)
Hemoglobin: 12.1 g/dL (ref 11.1–15.9)
Immature Grans (Abs): 0 x10E3/uL (ref 0.0–0.1)
Immature Granulocytes: 0 %
Lymphocytes Absolute: 2 x10E3/uL (ref 0.7–3.1)
Lymphs: 29 %
MCH: 31.2 pg (ref 26.6–33.0)
MCHC: 32.4 g/dL (ref 31.5–35.7)
MCV: 96 fL (ref 79–97)
Monocytes Absolute: 0.6 x10E3/uL (ref 0.1–0.9)
Monocytes: 9 %
Neutrophils Absolute: 4.3 x10E3/uL (ref 1.4–7.0)
Neutrophils: 58 %
Platelets: 269 x10E3/uL (ref 150–450)
RBC: 3.88 x10E6/uL (ref 3.77–5.28)
RDW: 12.2 % (ref 11.7–15.4)
WBC: 7.2 x10E3/uL (ref 3.4–10.8)

## 2024-06-16 LAB — VITAMIN D 25 HYDROXY (VIT D DEFICIENCY, FRACTURES): Vit D, 25-Hydroxy: 39 ng/mL (ref 30.0–100.0)

## 2024-06-16 LAB — TSH: TSH: 0.367 u[IU]/mL — ABNORMAL LOW (ref 0.450–4.500)

## 2024-06-16 LAB — T4, FREE: Free T4: 1.83 ng/dL — ABNORMAL HIGH (ref 0.82–1.77)

## 2024-06-16 MED ORDER — LEVOTHYROXINE SODIUM 88 MCG PO TABS: 88.0000 ug | ORAL_TABLET | Freq: Every day | ORAL | 3 refills | Status: AC

## 2024-06-16 NOTE — Progress Notes (Signed)
 Contacted via MyChart -- needs lab only visit in 6 weeks please.  Good day Charlotte Reyes, your labs have returned: - Thyroid  labs, TSH and Free T4, showing more hyper (overactive) levels.  I want to reduce Levothyroxine  just a little to 88 MCG.  Stop the 100 MCG dosing.  I sent in new dose to pharmacy.  Then we will need to recheck levels outpatient on labs in 6 weeks, staff will call to schedule this. - Remainder of labs stable.  No other changes needed.  Any questions? Keep being amazing!!  Thank you for allowing me to participate in your care.  I appreciate you. Kindest regards, Jeweliana Dudgeon

## 2024-06-17 NOTE — Progress Notes (Signed)
 Scheduled

## 2024-07-29 ENCOUNTER — Other Ambulatory Visit

## 2024-07-29 DIAGNOSIS — E039 Hypothyroidism, unspecified: Secondary | ICD-10-CM | POA: Diagnosis not present

## 2024-07-30 ENCOUNTER — Ambulatory Visit: Payer: Self-pay | Admitting: Nurse Practitioner

## 2024-07-30 LAB — TSH: TSH: 1.59 u[IU]/mL (ref 0.450–4.500)

## 2024-07-30 LAB — T4, FREE: Free T4: 1.69 ng/dL (ref 0.82–1.77)

## 2024-07-30 NOTE — Progress Notes (Signed)
 Contacted via MyChart  Good morning Cris, your thyroid  levels have returned and are better this check.  Continue current Levothyroxine  dosing.  Any questions? Keep being amazing!!  Thank you for allowing me to participate in your care.  I appreciate you. Kindest regards, Augusten Lipkin

## 2024-08-30 ENCOUNTER — Telehealth: Payer: Self-pay | Admitting: Nurse Practitioner

## 2024-08-30 NOTE — Telephone Encounter (Signed)
 Copied from CRM #[id number]. Topic: Medicare AWV >> Aug 30, 2024  9:00 AM Nathanel DEL wrote: Called LVM 08/30/2024 to sched AWV. Please schedule in office or virtual visit.   Nathanel Paschal; Care Guide Ambulatory Clinical Support [place] [facility] Direct Dial: [phone]

## 2024-10-03 NOTE — Patient Instructions (Incomplete)
 [employer] does Zepbound  [employer] for their program  [facility] El[facility]isa Lowers in [place] -- they do Zepbound  Be Involved in Caring For Your Health:  Taking Medications When medications are taken as directed, they can greatly improve your health. But if they are not taken as prescribed, they may not work. In some cases, not taking them correctly can be harmful. To help ensure your treatment remains effective and safe, understand your medications and how to take them. Bring your medications to each visit for review by your provider.  Your lab results, notes, and after visit summary will be available on My Chart. We strongly encourage you to use this feature. If lab results are abnormal the clinic will contact you with the appropriate steps. If the clinic does not contact you assume the results are satisfactory. You can always view your results on My Chart. If you have questions regarding your health or results, please contact the clinic during office hours. You can also ask questions on My Chart.  We at [facility] are grateful that you chose us  to provide your care. We strive to provide evidence-based and compassionate care and are always looking for feedback. If you get a survey from the clinic please complete this so we can hear your opinions.   Osteopenia  Osteopenia is a loss of thickness (density) inside the bones. Another name for osteopenia is low bone mass. Mild osteopenia is a normal part of aging. It is not a disease, and it does not cause symptoms. However, if you have osteopenia and continue to lose bone mass, you could develop a condition that causes the bones to become thin and break more easily (osteoporosis). Osteoporosis can cause you to lose some height, have back pain, and have a stooped posture. Although osteopenia is not a disease, making changes to your lifestyle and diet can help to prevent osteopenia from developing into osteoporosis. What are the  causes? Osteopenia is caused by loss of calcium  in the bones. Bones are constantly changing. Old bone cells are continually being replaced with new bone cells. This process builds new bone. The mineral calcium  is needed to build new bone and maintain bone density. Bone density is usually highest around age 76. After that, most people's bodies cannot replace all the bone they have lost with new bone. What increases the risk? You are more likely to develop this condition if: You are older than age 79. You are a woman who went through menopause early. You have a long illness that keeps you in bed. You do not get enough exercise. You lack certain nutrients (malnutrition). You have an overactive thyroid  gland (hyperthyroidism). You use products that contain nicotine or tobacco, such as cigarettes, e-cigarettes and chewing tobacco, or you drink a lot of alcohol. You are taking medicines that weaken the bones, such as steroids. What are the signs or symptoms? This condition does not cause any symptoms. You may have a slightly higher risk for bone breaks (fractures), so getting fractures more easily than normal may be an indication of osteopenia. How is this diagnosed? This condition may be diagnosed based on an X-ray exam that measures bone density (dual-energy X-ray absorptiometry, or DEXA). This test can measure bone density in your hips, spine, and wrists. Osteopenia has no symptoms, so this condition is usually diagnosed after a routine bone density screening test is done for osteoporosis. This routine screening is usually done for: Women who are age 96 or older. Men who  are age 58 or older. If you have risk factors for osteopenia, you may have the screening test at an earlier age. How is this treated? Making dietary and lifestyle changes can lower your risk for osteoporosis. If you have severe osteopenia that is close to becoming osteoporosis, this condition can be treated with medicines and  dietary supplements such as calcium  and vitamin D . These supplements help to rebuild bone density. Follow these instructions at home: Eating and drinking Eat a diet that is high in calcium  and vitamin D . Calcium  is found in dairy products, beans, salmon, and leafy green vegetables like spinach and broccoli. Look for foods that have vitamin D  and calcium  added to them (fortified foods), such as orange juice, cereal, and bread.  Lifestyle Do 30 minutes or more of a weight-bearing exercise every day, such as walking, jogging, or playing a sport. These types of exercises strengthen the bones. Do not use any products that contain nicotine or tobacco, such as cigarettes, e-cigarettes, and chewing tobacco. If you need help quitting, ask your health care provider. Do not drink alcohol if: Your health care provider tells you not to drink. You are pregnant, may be pregnant, or are planning to become pregnant. If you drink alcohol: Limit how much you use to: 0-1 drink a day for women. 0-2 drinks a day for men. Be aware of how much alcohol is in your drink. In the [place] one drink equals one 12 oz bottle of beer (355 mL), one 5 oz glass of wine (148 mL), or one 1 oz glass of hard liquor (44 mL). General instructions Take over-the-counter and prescription medicines only as told by your health care provider. These include vitamins and supplements. Take precautions at home to lower your risk of falling, such as: Keeping rooms well-lit and free of clutter, such as cords. Installing safety rails on stairs. Using rubber mats in the bathroom or other areas that are often wet or slippery. Keep all follow-up visits. This is important. Contact a health care provider if: You have not had a bone density screening for osteoporosis and you are: A woman who is age 71 or older. A man who is age 41 or older. You are a postmenopausal woman who has not had a bone density screening for osteoporosis. You are older than  age 86 and you want to know if you should have bone density screening for osteoporosis. Summary Osteopenia is a loss of thickness (density) inside the bones. Another name for osteopenia is low bone mass. Osteopenia is not a disease, but it may increase your risk for a condition that causes the bones to become thin and break more easily (osteoporosis). You may be at risk for osteopenia if you are older than age 27 or if you are a woman who went through early menopause. Osteopenia does not cause any symptoms, but it can be diagnosed with a bone density screening test. Dietary and lifestyle changes are the first treatment for osteopenia. These may lower your risk for osteoporosis. This information is not intended to replace advice given to you by your health care provider. Make sure you discuss any questions you have with your health care provider. Document Revised: 06/17/2023 Document Reviewed: 06/17/2023 Elsevier Patient Education  2025 [employer].

## 2024-10-05 ENCOUNTER — Ambulatory Visit (INDEPENDENT_AMBULATORY_CARE_PROVIDER_SITE_OTHER): Admitting: Nurse Practitioner

## 2024-10-05 ENCOUNTER — Encounter: Payer: Self-pay | Admitting: Nurse Practitioner

## 2024-10-05 VITALS — BP 124/84 | HR 73 | Temp 97.5°F | Resp 16 | Ht 62.01 in | Wt 180.6 lb

## 2024-10-05 DIAGNOSIS — K219 Gastro-esophageal reflux disease without esophagitis: Secondary | ICD-10-CM

## 2024-10-05 DIAGNOSIS — Z0000 Encounter for general adult medical examination without abnormal findings: Secondary | ICD-10-CM

## 2024-10-05 DIAGNOSIS — M8588 Other specified disorders of bone density and structure, other site: Secondary | ICD-10-CM

## 2024-10-05 DIAGNOSIS — Z6833 Body mass index (BMI) 33.0-33.9, adult: Secondary | ICD-10-CM

## 2024-10-05 DIAGNOSIS — E66811 Obesity, class 1: Secondary | ICD-10-CM | POA: Diagnosis not present

## 2024-10-05 DIAGNOSIS — E782 Mixed hyperlipidemia: Secondary | ICD-10-CM | POA: Diagnosis not present

## 2024-10-05 DIAGNOSIS — E6609 Other obesity due to excess calories: Secondary | ICD-10-CM

## 2024-10-05 DIAGNOSIS — E039 Hypothyroidism, unspecified: Secondary | ICD-10-CM

## 2024-10-05 DIAGNOSIS — F5104 Psychophysiologic insomnia: Secondary | ICD-10-CM

## 2024-10-05 DIAGNOSIS — E669 Obesity, unspecified: Secondary | ICD-10-CM | POA: Insufficient documentation

## 2024-10-05 DIAGNOSIS — Z853 Personal history of malignant neoplasm of breast: Secondary | ICD-10-CM

## 2024-10-05 MED ORDER — ZOLPIDEM TARTRATE 5 MG PO TABS: 5.0000 mg | ORAL_TABLET | Freq: Every evening | ORAL | 4 refills | Status: AC | PRN

## 2024-10-05 NOTE — Assessment & Plan Note (Signed)
History of with mastectomy and chemo/radiation + removal of some lymph nodes under left axilla in 1998.  Perform BP on right side.  Recommend she schedule annual mammogram.

## 2024-10-05 NOTE — Assessment & Plan Note (Signed)
 Last DEXA 11/06/23 with osteopenia noted.  Took Fosamax in past for osteoporosis on review, but stopped due to jaw issues.  Maintain off this.  Continue Vitamin D  supplement at home and check level today.  Repeat DEXA around 11/05/2028.

## 2024-10-05 NOTE — Assessment & Plan Note (Signed)
Chronic, ongoing.  Continue current Levothyroxine dosing and adjust as needed based on labs.  TSH and Free T4 today. 

## 2024-10-05 NOTE — Assessment & Plan Note (Signed)
 BMI 33.02, 22 lbs gained over past months. She is going to look into GLP1 treatments and cost. Will alert PCP if wishes to start. Educated her at length on these medications and side effects. No family history of thyroid  cancer (MTC, MEN 2, thyroid  cell tumors) or pancreatitis. Recommended eating smaller high protein, low fat meals more frequently and exercising 30 mins a day 5 times a week with a goal of 10-15lb weight loss in the next 3 months. Patient voiced their understanding and motivation to adhere to these recommendations.

## 2024-10-05 NOTE — Assessment & Plan Note (Signed)
Chronic, stable.  Only occasional heartburn.  Continue Protonix daily and collaboration with GI.  Risks of PPI use were discussed with patient including bone loss, C. Diff diarrhea, pneumonia, infections, CKD, electrolyte abnormalities.  Verbalizes understanding and chooses to continue the medication.  Check Mag level annually.

## 2024-10-05 NOTE — Progress Notes (Signed)
 "  BP 124/84 (BP Location: Left Arm, Patient Position: Sitting, Cuff Size: Normal)   Pulse 73   Temp (!) 97.5 F (36.4 C) (Oral)   Resp 16   Ht 5' 2.01 (1.575 m)   Wt 180 lb 9.6 oz (81.9 kg)   SpO2 98%   BMI 33.02 kg/m    Subjective:    Patient ID: Charlotte Reyes, female    DOB: [birth year], 73 y.o.   MRN: [id number]  HPI: Charlotte Reyes is a 73 y.o. female presenting on 10/05/2024 for comprehensive medical examination. Current medical complaints include:none  She currently lives with: self Menopausal Symptoms: no  HYPERLIPIDEMIA Takes Rosuvastatin  daily. Hyperlipidemia status: good compliance Satisfied with current treatment?  yes Side effects:  no Medication compliance: good compliance Supplements: none Aspirin:  no The 10-year ASCVD risk score (Arnett DK, et al., 2019) is: 13.2%   Values used to calculate the score:     Age: 71 years     Clinically relevant sex: Female     Is Non-Hispanic African American: No     Diabetic: No     Tobacco smoker: No     Systolic Blood Pressure: 124 mmHg     Is BP treated: No     HDL Cholesterol: 60 mg/dL     Total Cholesterol: 153 mg/dL Chest pain:  no Coronary artery disease:  no Family history CAD:  no -- but mother had tachy-brady syndrome and a pacemaker Family history early CAD:  no   HYPOTHYROIDISM Taking Levothyroxine  88 MCG. She is concerned about her weight gain -- has been working on loss.  Has cut back on portions and eating more clean.  Is not exercising. Has gained 22 lbs.  She continues to take Protonix  OTC daily for GERD. Thyroid  control status:stable Satisfied with current treatment? yes Medication side effects: no Medication compliance: good compliance Etiology of hypothyroidism: unknown Recent dose adjustment:no Fatigue: yes Cold intolerance: yes Heat intolerance: no Weight gain: yes Weight loss: no Constipation: no Diarrhea/loose stools: no Palpitations: occasional flutters Lower extremity  edema: no Anxiety/depressed mood: no   OSTEOPENIA Taking Vitamin D3 daily. No recent falls or fractures. Last DEXA 11/06/23. Satisfied with current treatment?: yes Adequate calcium  & vitamin D : yes Weight bearing exercises: yes   INSOMNIA Continues on Ambien  5 MG at night for sleep. Risks, benefits, side effects and alternative therapies were discussed. The opportunity to ask questions was provided and they were answered to the best of my ability. The patient expressed understanding and willingness to follow the outlined treatment protocols and was able to verbalize plan of care back to provider. She wishes to maintain Ambien  at this time. Was last filled on 08/29/24. Duration: chronic Satisfied with sleep quality: yes Difficulty falling asleep: yes Difficulty staying asleep: no Waking a few hours after sleep onset: no Early morning awakenings: no Daytime hypersomnolence: no Wakes feeling refreshed: no Good sleep hygiene: yes Apnea: no Snoring: no Depressed/anxious mood: no Recent stress: no Restless legs/nocturnal leg cramps: no Chronic pain/arthritis: no History of sleep study: no Treatments attempted: melatonin, uinsom, benadryl , and ambien     Depression Screen done today and results listed below:     10/05/2024    3:31 PM 06/15/2024    2:38 PM 11/13/2023   11:21 AM 09/29/2023    8:13 AM 08/26/2023    1:32 PM  Depression screen PHQ 2/9  Decreased Interest 0 0 0 0 0  Down, Depressed, Hopeless 0 0 0 0 0  PHQ - 2  Score 0 0 0 0 0  Altered sleeping 1 1 0 1 1  Tired, decreased energy 1 1 0 1 0  Change in appetite 1 1 0 0 0  Feeling bad or failure about yourself  0 0 0 0 0  Trouble concentrating 0 0 0 0 0  Moving slowly or fidgety/restless 0 0 0 0 0  Suicidal thoughts 0 0 0 0 0  PHQ-9 Score 3 3  0  2  1   Difficult doing work/chores   Not difficult at all Not difficult at all Not difficult at all     Data saved with a previous flowsheet row definition      10/05/2024     3:31 PM 06/15/2024    2:38 PM 11/13/2023   11:21 AM 09/29/2023    8:13 AM  GAD 7 : Generalized Anxiety Score  Nervous, Anxious, on Edge 0 0 0 0  Control/stop worrying 0 0 0 0  Worry too much - different things 1 0 0 0  Trouble relaxing 0 0 0 0  Restless 0 0 0 0  Easily annoyed or irritable 0 0 0 0  Afraid - awful might happen 0 0 0 0  Total GAD 7 Score 1 0 0 0  Anxiety Difficulty   Not difficult at all Not difficult at all        07/23/2023   11:13 AM 08/26/2023    1:32 PM 09/29/2023    8:13 AM 11/13/2023   11:21 AM 10/05/2024    3:31 PM  Fall Risk  Falls in the past year? 0 0 0 0 0  Was there an injury with Fall? 0  0  0  0  0  Fall Risk Category Calculator 0 0 0 0 0  Patient at Risk for Falls Due to No Fall Risks No Fall Risks No Fall Risks No Fall Risks No Fall Risks  Fall risk Follow up Falls evaluation completed Falls evaluation completed  Falls evaluation completed Falls evaluation completed     Data saved with a previous flowsheet row definition    Functional Status Survey: Is the patient deaf or have difficulty hearing?: No Does the patient have difficulty seeing, even when wearing glasses/contacts?: No Does the patient have difficulty concentrating, remembering, or making decisions?: No Does the patient have difficulty walking or climbing stairs?: No Does the patient have difficulty dressing or bathing?: No Does the patient have difficulty doing errands alone such as visiting a doctor's office or shopping?: No   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    Hypothyroidism    Sebaceous cyst    on back   Thyroid  disease     Surgical History:  Past Surgical History:  Procedure Laterality Date   COLONOSCOPY WITH PROPOFOL  N/A 09/02/2017   Procedure: COLONOSCOPY  WITH PROPOFOL ;  Surgeon: Gaylyn Gladis PENNER, MD;  Location: [facility];  Service: Endoscopy;  Laterality: N/A;   COLONOSCOPY WITH PROPOFOL  N/A 09/02/2023   Procedure: COLONOSCOPY WITH PROPOFOL ;  Surgeon: Maryruth Ole DASEN, MD;  Location: [facility];  Service: Endoscopy;  Laterality: N/A;   FLEXIBLE SIGMOIDOSCOPY N/A 11/21/2023   Procedure: FLEXIBLE SIGMOIDOSCOPY;  Surgeon: Maryruth Ole DASEN, MD;  Location: [facility];  Service: Endoscopy;  Laterality: N/A;   FLEXIBLE SIGMOIDOSCOPY N/A 01/01/2024   Procedure: FLEXIBLE SIGMOIDOSCOPY, URETERAL ICG;  Surgeon: Teresa Lonni HERO, MD;  Location: [facility];  Service: General;  Laterality: N/A;   INCISE AND DRAIN ABCESS     sebaceous cyst on back   MASTECTOMY  1998   left   SUBMUCOSAL TATTOO INJECTION  11/21/2023   Procedure: SUBMUCOSAL TATTOO INJECTION;  Surgeon: Maryruth Ole DASEN, MD;  Location: [facility];  Service: Endoscopy;;   TOTAL ABDOMINAL HYSTERECTOMY  1994   XI ROBOTIC ASSISTED LOWER ANTERIOR RESECTION N/A 01/01/2024   Procedure: RESECTION, RECTUM, LOW ANTERIOR, ROBOT-ASSISTED;  Surgeon: Teresa Lonni HERO, MD;  Location: [facility];  Service: General;  Laterality: N/A;    Medications:  Current Outpatient Medications on File Prior to Visit  Medication Sig   Cholecalciferol  (VITAMIN D3) 50 MCG (2000 UT) capsule Take 2,000 Units by mouth daily.   fexofenadine (ALLEGRA) 180 MG tablet Take 180 mg by mouth daily as needed for allergies.   levothyroxine  (SYNTHROID ) 88 MCG tablet Take 1 tablet (88 mcg total) by mouth daily.   Magnesium 400 MG TABS Take 400 mg by mouth daily.   pantoprazole  (PROTONIX ) 40 MG tablet Take 1 tablet by mouth daily.   POTASSIUM PO Take 1 tablet by mouth daily.   rosuvastatin  (CRESTOR ) 10 MG tablet Take 1 tablet (10 mg total) by mouth daily.   TURMERIC PO Take 2,000 mg by mouth daily at 2 PM.   No current facility-administered medications on file prior to visit.    Allergies:   Allergies[1]  Social History:  Social History   Socioeconomic History   Marital status: Widowed    Spouse name: Not on file   Number of children: Not on file   Years of education: Not on file   Highest education level: Bachelor's degree (e.g., BA, AB, BS)  Occupational History   Not on file  Tobacco Use   Smoking status: Never   Smokeless tobacco: Never  Vaping Use   Vaping status: Never Used  Substance and Sexual Activity   Alcohol use: Yes    Comment: socially   Drug use: No   Sexual activity: Not Currently  Other Topics Concern   Not on file  Social History Narrative   Lives at home alone   Social Drivers of Health   Tobacco Use: Low Risk (10/05/2024)   Patient History    Smoking Tobacco Use: Never    Smokeless Tobacco Use: Never    Passive Exposure: Not on file  Financial Resource Strain: Low Risk (08/25/2023)   Overall Financial Resource Strain (CARDIA)    Difficulty of Paying Living Expenses: Not hard at all  Food Insecurity: No Food Insecurity (01/01/2024)   Hunger Vital Sign    Worried About Running Out of Food in the Last Year: Never true    Ran Out of Food in the Last Year: Never true  Transportation Needs: No Transportation Needs (01/01/2024)   PRAPARE - Administrator, Civil Service (Medical): No    Lack of Transportation (Non-Medical): No  Physical Activity: Inactive (08/25/2023)   Exercise Vital Sign    Days of Exercise per Week: 0 days    Minutes of Exercise per Session: 30 min  Stress: No Stress Concern Present (08/25/2023)   [employer] of Occupational Health - Occupational Stress Questionnaire    Feeling of Stress : Only a little  Social Connections: Moderately Integrated (01/01/2024)   Social Connection and Isolation Panel    Frequency  of Communication with Friends and Family: More than three times a week    Frequency of Social Gatherings with Friends and Family: More than three times a week    Attends Religious Services:  More than 4 times per year    Active Member of [employer] or Organizations: Yes    Attends Banker Meetings: More than 4 times per year    Marital Status: Widowed  Intimate Partner Violence: Not At Risk (01/01/2024)   Humiliation, Afraid, Rape, and Kick questionnaire    Fear of Current or Ex-Partner: No    Emotionally Abused: No    Physically Abused: No    Sexually Abused: No  Depression (PHQ2-9): Low Risk (10/05/2024)   Depression (PHQ2-9)    PHQ-2 Score: 3  Alcohol Screen: Low Risk (08/25/2023)   Alcohol Screen    Last Alcohol Screening Score (AUDIT): 1  Housing: Low Risk (01/01/2024)   Housing Stability Vital Sign    Unable to Pay for Housing in the Last Year: No    Number of Times Moved in the Last Year: 0    Homeless in the Last Year: No  Utilities: Not At Risk (01/01/2024)   AHC Utilities    Threatened with loss of utilities: No  Health Literacy: Adequate Health Literacy (07/23/2023)   B1300 Health Literacy    Frequency of need for help with medical instructions: Never   Tobacco Use History[2] Social History   Substance and Sexual Activity  Alcohol Use Yes   Comment: socially    Family History:  Family History  Problem Relation Age of Onset   Cancer Mother        ovarian   Cancer Father        colon   Hemophilia Brother    Clotting disorder Brother    Breast cancer Paternal Aunt 57    Past medical history, surgical history, medications, allergies, family history and social history reviewed with patient today and changes made to appropriate areas of the chart.   ROS All other ROS negative except what is listed above and in the HPI.      Objective:    BP 124/84 (BP Location: Left Arm, Patient Position: Sitting, Cuff Size: Normal)   Pulse 73   Temp (!) 97.5 F (36.4 C) (Oral)   Resp 16   Ht 5' 2.01 (1.575 m)   Wt 180 lb 9.6 oz (81.9 kg)   SpO2 98%   BMI 33.02 kg/m   Wt Readings from Last 3 Encounters:  10/05/24 180 lb 9.6 oz (81.9 kg)   06/15/24 158 lb (71.7 kg)  01/02/24 159 lb 6.3 oz (72.3 kg)    Physical Exam Vitals and nursing note reviewed.  Constitutional:      General: She is awake. She is not in acute distress.    Appearance: She is well-developed and well-groomed. She is obese. She is not ill-appearing or toxic-appearing.  HENT:     Head: Normocephalic and atraumatic.     Right Ear: Hearing, tympanic membrane, ear canal and external ear normal. No drainage.     Left Ear: Hearing, tympanic membrane, ear canal and external ear normal. No drainage.     Nose: Nose normal.     Right Sinus: No maxillary sinus tenderness or frontal sinus tenderness.     Left Sinus: No maxillary sinus tenderness or frontal sinus tenderness.     Mouth/Throat:     Mouth: Mucous membranes are moist.     Pharynx: Oropharynx is clear. Uvula  midline. No pharyngeal swelling, oropharyngeal exudate or posterior oropharyngeal erythema.  Eyes:     General: Lids are normal.        Right eye: No discharge.        Left eye: No discharge.     Extraocular Movements: Extraocular movements intact.     Conjunctiva/sclera: Conjunctivae normal.     Pupils: Pupils are equal, round, and reactive to light.     Visual Fields: Right eye visual fields normal and left eye visual fields normal.  Neck:     Thyroid : No thyromegaly.     Vascular: No carotid bruit.     Trachea: Trachea normal.  Cardiovascular:     Rate and Rhythm: Normal rate and regular rhythm.     Heart sounds: Normal heart sounds. No murmur heard.    No gallop.  Pulmonary:     Effort: Pulmonary effort is normal. No accessory muscle usage or respiratory distress.     Breath sounds: Normal breath sounds. No decreased breath sounds, wheezing or rales.  Chest:     Comments: Deferred per patient request. Abdominal:     General: Bowel sounds are normal.     Palpations: Abdomen is soft. There is no hepatomegaly or splenomegaly.     Tenderness: There is no abdominal tenderness.   Musculoskeletal:        General: Normal range of motion.     Cervical back: Normal range of motion and neck supple.     Right lower leg: No edema.     Left lower leg: No edema.  Lymphadenopathy:     Head:     Right side of head: No submental, submandibular, tonsillar, preauricular or posterior auricular adenopathy.     Left side of head: No submental, submandibular, tonsillar, preauricular or posterior auricular adenopathy.     Cervical: No cervical adenopathy.  Skin:    General: Skin is warm and dry.     Capillary Refill: Capillary refill takes less than 2 seconds.     Findings: No rash.  Neurological:     Mental Status: She is alert and oriented to person, place, and time.     Gait: Gait is intact.     Deep Tendon Reflexes: Reflexes are normal and symmetric.     Reflex Scores:      Brachioradialis reflexes are 2+ on the right side and 2+ on the left side.      Patellar reflexes are 2+ on the right side and 2+ on the left side. Psychiatric:        Attention and Perception: Attention normal.        Mood and Affect: Mood normal.        Speech: Speech normal.        Behavior: Behavior normal. Behavior is cooperative.        Thought Content: Thought content normal.        Judgment: Judgment normal.    Results for orders placed or performed in visit on 07/29/24  TSH   Collection Time: 07/29/24  2:29 PM  Result Value Ref Range   TSH 1.590 0.450 - 4.500 uIU/mL  T4, free   Collection Time: 07/29/24  2:29 PM  Result Value Ref Range   Free T4 1.69 0.82 - 1.77 ng/dL      Assessment & Plan:   Problem List Items Addressed This Visit       Digestive   Esophageal reflux   Chronic, stable.  Only occasional heartburn.  Continue Protonix  daily  and collaboration with GI.  Risks of PPI use were discussed with patient including bone loss, C. Diff diarrhea, pneumonia, infections, CKD, electrolyte abnormalities.  Verbalizes understanding and chooses to continue the medication.  Check Mag  level annually.       Relevant Orders   Magnesium     Endocrine   Hypothyroid   Chronic, ongoing.  Continue current Levothyroxine  dosing and adjust as needed based on labs.  TSH and Free T4 today.      Relevant Orders   CBC with Differential/Platelet   T4, free   TSH     Musculoskeletal and Integument   Osteopenia of lumbar spine   Last DEXA 11/06/23 with osteopenia noted.  Took Fosamax in past for osteoporosis on review, but stopped due to jaw issues.  Maintain off this.  Continue Vitamin D  supplement at home and check level today.  Repeat DEXA around 11/05/2028.        Other   Obesity   BMI 33.02, 22 lbs gained over past months. She is going to look into GLP1 treatments and cost. Will alert PCP if wishes to start. Educated her at length on these medications and side effects. No family history of thyroid  cancer (MTC, MEN 2, thyroid  cell tumors) or pancreatitis. Recommended eating smaller high protein, low fat meals more frequently and exercising 30 mins a day 5 times a week with a goal of 10-15lb weight loss in the next 3 months. Patient voiced their understanding and motivation to adhere to these recommendations.       Mixed hyperlipidemia - Primary   Chronic, ongoing.  Continue current medication regimen and adjust as needed. Labs today.      Relevant Orders   Comprehensive metabolic panel with GFR   Lipid Panel w/o Chol/HDL Ratio   Insomnia   Chronic and stable low dose Ambien .  After loss of husband in March 2024 started.  Melatonin offered no benefit and Trazodone  did not work at low dose, but at next dose up made her too groggy.  Not to use Tylenol  PM due to Benadryl  and BEERS criteria. Insurance would not cover Belsomra , but would cover Ambien  and Temazepam as alternatives.  Will continue Ambien  5 MG nightly and recommend she try to take this as little as possible.  Discussed with her, she is aware of BEERS criteria on these, however insurance will not cover Belsomra  until  she has tried alternatives they listed.        History of cancer of left breast   History of with mastectomy and chemo/radiation + removal of some lymph nodes under left axilla in 1998.  Perform BP on right side.  Recommend she schedule annual mammogram.      Relevant Orders   MM 3D SCREENING MAMMOGRAM UNILATERAL RIGHT BREAST   Other Visit Diagnoses       Encounter for annual physical exam       Annual physical today with labs and health maintenance reviewed, discussed with patient.        Follow up plan: Return in about 6 months (around 04/05/2025) for INSOMNIA AND HLD.   LABORATORY TESTING:  - Pap smear: not applicable  IMMUNIZATIONS:   - Tdap: Tetanus vaccination status reviewed: refused. - Influenza: Up to date - Pneumovax: Up to date - Prevnar: Up to date - COVID: Up to date x 4 doses - HPV: Not applicable - Shingrix vaccine: Refused  SCREENING: -Mammogram: Up to date due 11/05/24 - Colonoscopy: Up to date  - Bone  Density: Up to date due next 11/05/28 -Hearing Test: Not applicable  -Spirometry: Not applicable   PATIENT COUNSELING:   Advised to take 1 mg of folate supplement per day if capable of pregnancy.   Sexuality: Discussed sexually transmitted diseases, partner selection, use of condoms, avoidance of unintended pregnancy  and contraceptive alternatives.   Advised to avoid cigarette smoking.  I discussed with the patient that most people either abstain from alcohol or drink within safe limits (<=14/week and <=4 drinks/occasion for males, <=7/weeks and <= 3 drinks/occasion for females) and that the risk for alcohol disorders and other health effects rises proportionally with the number of drinks per week and how often a drinker exceeds daily limits.  Discussed cessation/primary prevention of drug use and availability of treatment for abuse.   Diet: Encouraged to adjust caloric intake to maintain  or achieve ideal body weight, to reduce intake of dietary  saturated fat and total fat, to limit sodium intake by avoiding high sodium foods and not adding table salt, and to maintain adequate dietary potassium and calcium  preferably from fresh fruits, vegetables, and low-fat dairy products.    Stressed the importance of regular exercise  Injury prevention: Discussed safety belts, safety helmets, smoke detector, smoking near bedding or upholstery.   Dental health: Discussed importance of regular tooth brushing, flossing, and dental visits.    NEXT PREVENTATIVE PHYSICAL DUE IN 1 YEAR. Return in about 6 months (around 04/05/2025) for INSOMNIA AND HLD.              [1]  Allergies Allergen Reactions   Amoxicillin -Pot Clavulanate Rash    Rash on abdomen and trunk  [2]  Social History Tobacco Use  Smoking Status Never  Smokeless Tobacco Never   "

## 2024-10-05 NOTE — Assessment & Plan Note (Signed)
 Chronic and stable low dose Ambien .  After loss of husband in March 2024 started.  Melatonin offered no benefit and Trazodone  did not work at low dose, but at next dose up made her too groggy.  Not to use Tylenol  PM due to Benadryl  and BEERS criteria. Insurance would not cover Belsomra , but would cover Ambien  and Temazepam as alternatives.  Will continue Ambien  5 MG nightly and recommend she try to take this as little as possible.  Discussed with her, she is aware of BEERS criteria on these, however insurance will not cover Belsomra  until she has tried alternatives they listed.

## 2024-10-05 NOTE — Assessment & Plan Note (Signed)
 Chronic, ongoing.  Continue current medication regimen and adjust as needed.  Labs today.

## 2024-10-06 ENCOUNTER — Ambulatory Visit: Payer: Self-pay | Admitting: Nurse Practitioner

## 2024-10-06 LAB — CBC WITH DIFFERENTIAL/PLATELET
Basophils Absolute: 0 x10E3/uL (ref 0.0–0.2)
Basos: 1 %
EOS (ABSOLUTE): 0.2 x10E3/uL (ref 0.0–0.4)
Eos: 2 %
Hematocrit: 39.3 % (ref 34.0–46.6)
Hemoglobin: 12.6 g/dL (ref 11.1–15.9)
Immature Grans (Abs): 0 x10E3/uL (ref 0.0–0.1)
Immature Granulocytes: 0 %
Lymphocytes Absolute: 2.2 x10E3/uL (ref 0.7–3.1)
Lymphs: 35 %
MCH: 30.3 pg (ref 26.6–33.0)
MCHC: 32.1 g/dL (ref 31.5–35.7)
MCV: 95 fL (ref 79–97)
Monocytes Absolute: 0.6 x10E3/uL (ref 0.1–0.9)
Monocytes: 9 %
Neutrophils Absolute: 3.3 x10E3/uL (ref 1.4–7.0)
Neutrophils: 53 %
Platelets: 252 x10E3/uL (ref 150–450)
RBC: 4.16 x10E6/uL (ref 3.77–5.28)
RDW: 12.9 % (ref 11.7–15.4)
WBC: 6.2 x10E3/uL (ref 3.4–10.8)

## 2024-10-06 LAB — COMPREHENSIVE METABOLIC PANEL WITH GFR
ALT: 24 IU/L (ref 0–32)
AST: 18 IU/L (ref 0–40)
Albumin: 4.3 g/dL (ref 3.8–4.8)
Alkaline Phosphatase: 76 IU/L (ref 49–135)
BUN/Creatinine Ratio: 12 (ref 12–28)
BUN: 10 mg/dL (ref 8–27)
Bilirubin Total: 0.3 mg/dL (ref 0.0–1.2)
CO2: 26 mmol/L (ref 20–29)
Calcium: 9.2 mg/dL (ref 8.7–10.3)
Chloride: 101 mmol/L (ref 96–106)
Creatinine, Ser: 0.85 mg/dL (ref 0.57–1.00)
Globulin, Total: 2.5 g/dL (ref 1.5–4.5)
Glucose: 81 mg/dL (ref 70–99)
Potassium: 4.6 mmol/L (ref 3.5–5.2)
Sodium: 140 mmol/L (ref 134–144)
Total Protein: 6.8 g/dL (ref 6.0–8.5)
eGFR: 72 mL/min/1.73

## 2024-10-06 LAB — LIPID PANEL W/O CHOL/HDL RATIO
Cholesterol, Total: 160 mg/dL (ref 100–199)
HDL: 61 mg/dL
LDL Chol Calc (NIH): 78 mg/dL (ref 0–99)
Triglycerides: 117 mg/dL (ref 0–149)
VLDL Cholesterol Cal: 21 mg/dL (ref 5–40)

## 2024-10-06 LAB — MAGNESIUM: Magnesium: 2.2 mg/dL (ref 1.6–2.3)

## 2024-10-06 NOTE — Progress Notes (Signed)
 Contacted via MyChart  Good morning Charlotte Reyes, your labs have returned and overall look fantastic. I have no concerns on these. Any questions? No medication changes needed. Thyroid  levels will return later. Keep being amazing!!  Thank you for allowing me to participate in your care.  I appreciate you. Kindest regards, Depaul Arizpe

## 2024-10-07 LAB — SPECIMEN STATUS REPORT

## 2024-10-07 LAB — TSH: TSH: 1.28 u[IU]/mL (ref 0.450–4.500)

## 2024-10-07 LAB — T4, FREE: Free T4: 1.82 ng/dL — AB (ref 0.82–1.77)

## 2024-10-08 NOTE — Progress Notes (Signed)
 Patient cam in to do labs later int he day.

## 2024-10-08 NOTE — Progress Notes (Signed)
 Contacted via MyChart  Good morning Charlotte Reyes, your labs have returned and TSH is within normal range. Free T4 very mildly elevated. I would recommend we continue your current Levothyroxine  dose and continue to monitor. Any questions? Keep being amazing!!  Thank you for allowing me to participate in your care.  I appreciate you. Kindest regards, Jadyn Barge

## 2024-10-21 ENCOUNTER — Ambulatory Visit

## 2024-11-09 ENCOUNTER — Ambulatory Visit
Admission: RE | Admit: 2024-11-09 | Discharge: 2024-11-09 | Disposition: A | Discharge status: Home / Self Care | Source: Ambulatory Visit | Attending: Nurse Practitioner | Admitting: Nurse Practitioner

## 2024-11-09 DIAGNOSIS — Z1231 Encounter for screening mammogram for malignant neoplasm of breast: Secondary | ICD-10-CM | POA: Insufficient documentation

## 2024-11-09 DIAGNOSIS — Z853 Personal history of malignant neoplasm of breast: Secondary | ICD-10-CM | POA: Diagnosis present

## 2024-11-09 NOTE — Progress Notes (Signed)
 Contacted via MyChart   Normal mammogram, may repeat in one year:)

## 2024-11-16 ENCOUNTER — Ambulatory Visit

## 2024-12-14 ENCOUNTER — Ambulatory Visit: Admitting: Nurse Practitioner

## 2024-12-16 ENCOUNTER — Ambulatory Visit

## 2025-04-05 ENCOUNTER — Ambulatory Visit: Admitting: Nurse Practitioner
